# Patient Record
Sex: Male | Born: 1975 | Race: White | Hispanic: No | Smoking: Current every day smoker
Health system: Southern US, Community
[De-identification: ages and names within clinical notes are randomized; demographics above are authoritative.]

## PROBLEM LIST (undated history)

## (undated) DIAGNOSIS — K7460 Unspecified cirrhosis of liver: Secondary | ICD-10-CM

## (undated) HISTORY — PX: HERNIA REPAIR: SHX51

---

## 2004-10-05 ENCOUNTER — Emergency Department: Payer: Self-pay | Admitting: Emergency Medicine

## 2005-07-09 ENCOUNTER — Emergency Department: Payer: Self-pay | Admitting: Emergency Medicine

## 2006-05-12 ENCOUNTER — Emergency Department: Payer: Self-pay | Admitting: General Practice

## 2006-05-13 ENCOUNTER — Emergency Department: Payer: Self-pay | Admitting: Unknown Physician Specialty

## 2006-07-31 ENCOUNTER — Emergency Department (HOSPITAL_COMMUNITY): Admission: EM | Admit: 2006-07-31 | Discharge: 2006-07-31 | Payer: Self-pay | Admitting: Emergency Medicine

## 2006-08-12 ENCOUNTER — Emergency Department: Payer: Self-pay | Admitting: Emergency Medicine

## 2007-07-31 ENCOUNTER — Emergency Department: Payer: Self-pay | Admitting: Emergency Medicine

## 2011-09-07 ENCOUNTER — Emergency Department: Payer: Self-pay | Admitting: Emergency Medicine

## 2011-09-07 LAB — URINALYSIS, COMPLETE
Bacteria: NONE SEEN
Ketone: NEGATIVE
Protein: NEGATIVE
Specific Gravity: 1.005 (ref 1.003–1.030)
WBC UR: <1 /HPF (ref 0–5)

## 2011-11-25 ENCOUNTER — Emergency Department: Payer: Self-pay | Admitting: *Deleted

## 2011-12-05 ENCOUNTER — Inpatient Hospital Stay: Payer: Self-pay | Admitting: Internal Medicine

## 2011-12-05 LAB — CBC
HCT: 34.2 % — ABNORMAL LOW (ref 40.0–52.0)
HGB: 12.2 g/dL — ABNORMAL LOW (ref 13.0–18.0)
MCH: 40.6 pg — ABNORMAL HIGH (ref 26.0–34.0)
Platelet: 91 10*3/uL — ABNORMAL LOW (ref 150–440)
WBC: 10.5 10*3/uL (ref 3.8–10.6)

## 2011-12-05 LAB — URINALYSIS, COMPLETE
Nitrite: NEGATIVE
RBC,UR: NONE SEEN /HPF (ref 0–5)

## 2011-12-05 LAB — COMPREHENSIVE METABOLIC PANEL
Albumin: 1.5 g/dL — ABNORMAL LOW (ref 3.4–5.0)
Anion Gap: 11 (ref 7–16)
Bilirubin,Total: 16.4 mg/dL — ABNORMAL HIGH (ref 0.2–1.0)
Calcium, Total: 7.1 mg/dL — ABNORMAL LOW (ref 8.5–10.1)
Chloride: 81 mmol/L — ABNORMAL LOW (ref 98–107)
Creatinine: 0.52 mg/dL — ABNORMAL LOW (ref 0.60–1.30)
Glucose: 92 mg/dL (ref 65–99)
SGOT(AST): 232 U/L — ABNORMAL HIGH (ref 15–37)
Total Protein: 7.2 g/dL (ref 6.4–8.2)

## 2011-12-05 LAB — LIPASE, BLOOD: Lipase: 389 U/L (ref 73–393)

## 2011-12-05 LAB — DRUG SCREEN, URINE
Barbiturates, Ur Screen: NEGATIVE (ref ?–200)
Benzodiazepine, Ur Scrn: NEGATIVE (ref ?–200)
Cannabinoid 50 Ng, Ur ~~LOC~~: POSITIVE (ref ?–50)
Cocaine Metabolite,Ur ~~LOC~~: NEGATIVE (ref ?–300)
MDMA (Ecstasy)Ur Screen: NEGATIVE (ref ?–500)
Methadone, Ur Screen: NEGATIVE (ref ?–300)
Opiate, Ur Screen: NEGATIVE (ref ?–300)
Phencyclidine (PCP) Ur S: NEGATIVE (ref ?–25)
Tricyclic, Ur Screen: NEGATIVE (ref ?–1000)

## 2011-12-05 LAB — PROTIME-INR: Prothrombin Time: 29.1 secs — ABNORMAL HIGH (ref 11.5–14.7)

## 2011-12-05 LAB — TSH: Thyroid Stimulating Horm: 2.24 u[IU]/mL

## 2011-12-06 LAB — HEPATIC FUNCTION PANEL A (ARMC)
Alkaline Phosphatase: 112 U/L (ref 50–136)
Bilirubin,Total: 16.5 mg/dL — ABNORMAL HIGH (ref 0.2–1.0)

## 2011-12-06 LAB — CBC WITH DIFFERENTIAL/PLATELET
Basophil #: 0 10*3/uL (ref 0.0–0.1)
Eosinophil #: 0 10*3/uL (ref 0.0–0.7)
HCT: 30.6 % — ABNORMAL LOW (ref 40.0–52.0)
HGB: 11.1 g/dL — ABNORMAL LOW (ref 13.0–18.0)
Lymphocyte #: 0.5 10*3/uL — ABNORMAL LOW (ref 1.0–3.6)
Lymphocyte %: 5.5 %
MCH: 41.4 pg — ABNORMAL HIGH (ref 26.0–34.0)
Monocyte #: 0.6 x10 3/mm (ref 0.2–1.0)
Monocyte %: 7.1 %
Platelet: 72 10*3/uL — ABNORMAL LOW (ref 150–440)

## 2011-12-06 LAB — BASIC METABOLIC PANEL
Calcium, Total: 6.7 mg/dL — CL (ref 8.5–10.1)
Co2: 26 mmol/L (ref 21–32)
EGFR (African American): >60
Glucose: 52 mg/dL — ABNORMAL LOW (ref 65–99)
Sodium: 129 mmol/L — ABNORMAL LOW (ref 136–145)

## 2011-12-06 LAB — MAGNESIUM
Magnesium: 2 mg/dL
Magnesium: 2 mg/dL

## 2011-12-06 LAB — PROTIME-INR: Prothrombin Time: 24.5 secs — ABNORMAL HIGH (ref 11.5–14.7)

## 2011-12-06 LAB — HEMATOCRIT: HCT: 31.1 % — ABNORMAL LOW (ref 40.0–52.0)

## 2011-12-07 LAB — COMPREHENSIVE METABOLIC PANEL
Alkaline Phosphatase: 113 U/L (ref 50–136)
Calcium, Total: 7.3 mg/dL — ABNORMAL LOW (ref 8.5–10.1)
Chloride: 97 mmol/L — ABNORMAL LOW (ref 98–107)
Co2: 27 mmol/L (ref 21–32)
Creatinine: 0.52 mg/dL — ABNORMAL LOW (ref 0.60–1.30)
EGFR (African American): >60
EGFR (Non-African Amer.): >60
Potassium: 3.6 mmol/L (ref 3.5–5.1)
SGPT (ALT): 62 U/L
Sodium: 132 mmol/L — ABNORMAL LOW (ref 136–145)

## 2011-12-07 LAB — ACETAMINOPHEN LEVEL: Acetaminophen: <2 ug/mL

## 2011-12-07 LAB — URINE CULTURE

## 2011-12-07 LAB — MAGNESIUM: Magnesium: 2.1 mg/dL

## 2011-12-08 LAB — COMPREHENSIVE METABOLIC PANEL
Albumin: 1.5 g/dL — ABNORMAL LOW (ref 3.4–5.0)
Alkaline Phosphatase: 98 U/L (ref 50–136)
Anion Gap: 9 (ref 7–16)
BUN: 7 mg/dL (ref 7–18)
Bilirubin,Total: 18.1 mg/dL — ABNORMAL HIGH (ref 0.2–1.0)
Calcium, Total: 7.1 mg/dL — ABNORMAL LOW (ref 8.5–10.1)
Creatinine: 0.56 mg/dL — ABNORMAL LOW (ref 0.60–1.30)
EGFR (African American): >60
EGFR (Non-African Amer.): >60
Glucose: 76 mg/dL (ref 65–99)
Osmolality: 267 (ref 275–301)
SGOT(AST): 207 U/L — ABNORMAL HIGH (ref 15–37)
Sodium: 135 mmol/L — ABNORMAL LOW (ref 136–145)
Total Protein: 6.2 g/dL — ABNORMAL LOW (ref 6.4–8.2)

## 2011-12-08 LAB — PROTIME-INR: INR: 2

## 2011-12-08 LAB — APTT: Activated PTT: 65.3 secs — ABNORMAL HIGH (ref 23.6–35.9)

## 2011-12-09 LAB — COMPREHENSIVE METABOLIC PANEL
Anion Gap: 9 (ref 7–16)
Bilirubin,Total: 18.9 mg/dL — ABNORMAL HIGH (ref 0.2–1.0)
Calcium, Total: 7 mg/dL — CL (ref 8.5–10.1)
Co2: 25 mmol/L (ref 21–32)
EGFR (Non-African Amer.): >60
Glucose: 101 mg/dL — ABNORMAL HIGH (ref 65–99)
Osmolality: 267 (ref 275–301)
Potassium: 3.1 mmol/L — ABNORMAL LOW (ref 3.5–5.1)
SGOT(AST): 209 U/L — ABNORMAL HIGH (ref 15–37)
SGPT (ALT): 99 U/L — ABNORMAL HIGH

## 2011-12-09 LAB — HEMOGLOBIN: HGB: 10 g/dL — ABNORMAL LOW (ref 13.0–18.0)

## 2011-12-09 LAB — MAGNESIUM: Magnesium: 1.8 mg/dL

## 2011-12-10 LAB — COMPREHENSIVE METABOLIC PANEL
Albumin: 1.6 g/dL — ABNORMAL LOW (ref 3.4–5.0)
Anion Gap: 8 (ref 7–16)
Bilirubin,Total: 19 mg/dL — ABNORMAL HIGH (ref 0.2–1.0)
Co2: 28 mmol/L (ref 21–32)
Creatinine: 0.65 mg/dL (ref 0.60–1.30)
EGFR (African American): >60
EGFR (Non-African Amer.): >60
Glucose: 95 mg/dL (ref 65–99)
Osmolality: 272 (ref 275–301)
SGPT (ALT): 114 U/L — ABNORMAL HIGH
Sodium: 137 mmol/L (ref 136–145)
Total Protein: 6.5 g/dL (ref 6.4–8.2)

## 2011-12-10 LAB — CBC WITH DIFFERENTIAL/PLATELET
HCT: 29.2 % — ABNORMAL LOW (ref 40.0–52.0)
HGB: 10.3 g/dL — ABNORMAL LOW (ref 13.0–18.0)
MCHC: 35.2 g/dL (ref 32.0–36.0)
Myelocyte: 1 %
Platelet: 127 10*3/uL — ABNORMAL LOW (ref 150–440)
RBC: 2.47 10*6/uL — ABNORMAL LOW (ref 4.40–5.90)
RDW: 18.8 % — ABNORMAL HIGH (ref 11.5–14.5)
Segmented Neutrophils: 76 %
WBC: 17.1 10*3/uL — ABNORMAL HIGH (ref 3.8–10.6)

## 2011-12-11 LAB — BASIC METABOLIC PANEL
Anion Gap: 8 (ref 7–16)
BUN: 8 mg/dL (ref 7–18)
Chloride: 100 mmol/L (ref 98–107)
Co2: 27 mmol/L (ref 21–32)
EGFR (African American): >60
Osmolality: 268 (ref 275–301)
Potassium: 3.6 mmol/L (ref 3.5–5.1)

## 2011-12-11 LAB — CULTURE, BLOOD (SINGLE)

## 2011-12-11 LAB — HEPATIC FUNCTION PANEL A (ARMC)
Alkaline Phosphatase: 122 U/L (ref 50–136)
Bilirubin, Direct: 14.3 mg/dL — ABNORMAL HIGH (ref 0.00–0.20)
SGOT(AST): 195 U/L — ABNORMAL HIGH (ref 15–37)

## 2011-12-16 ENCOUNTER — Inpatient Hospital Stay: Payer: Self-pay | Admitting: Internal Medicine

## 2011-12-16 LAB — URINALYSIS, COMPLETE
Glucose,UR: NEGATIVE mg/dL (ref 0–75)
Ketone: NEGATIVE
Leukocyte Esterase: NEGATIVE
Nitrite: NEGATIVE
Specific Gravity: 1.013 (ref 1.003–1.030)
WBC UR: 1 /HPF (ref 0–5)

## 2011-12-16 LAB — PROTIME-INR: INR: 1.7

## 2011-12-16 LAB — CBC WITH DIFFERENTIAL/PLATELET
Basophil #: 0.2 10*3/uL — ABNORMAL HIGH (ref 0.0–0.1)
Basophil %: 1 %
Eosinophil #: 0 10*3/uL (ref 0.0–0.7)
Eosinophil %: 0.1 %
HCT: 29 % — ABNORMAL LOW (ref 40.0–52.0)
Lymphocyte #: 0.3 10*3/uL — ABNORMAL LOW (ref 1.0–3.6)
Lymphocyte %: 1.7 %
Monocyte #: 1 x10 3/mm (ref 0.2–1.0)
Neutrophil #: 18.9 10*3/uL — ABNORMAL HIGH (ref 1.4–6.5)
Neutrophil %: 92.5 %
Platelet: 219 10*3/uL (ref 150–440)
RBC: 2.42 10*6/uL — ABNORMAL LOW (ref 4.40–5.90)
RDW: 17.4 % — ABNORMAL HIGH (ref 11.5–14.5)

## 2011-12-16 LAB — COMPREHENSIVE METABOLIC PANEL
Alkaline Phosphatase: 141 U/L — ABNORMAL HIGH (ref 50–136)
BUN: 6 mg/dL — ABNORMAL LOW (ref 7–18)
Calcium, Total: 7 mg/dL — CL (ref 8.5–10.1)
Chloride: 95 mmol/L — ABNORMAL LOW (ref 98–107)
Co2: 27 mmol/L (ref 21–32)
Creatinine: 0.55 mg/dL — ABNORMAL LOW (ref 0.60–1.30)
EGFR (Non-African Amer.): >60
Glucose: 125 mg/dL — ABNORMAL HIGH (ref 65–99)
Osmolality: 260 (ref 275–301)
Potassium: 3.7 mmol/L (ref 3.5–5.1)
SGOT(AST): 175 U/L — ABNORMAL HIGH (ref 15–37)
Sodium: 130 mmol/L — ABNORMAL LOW (ref 136–145)

## 2011-12-16 LAB — ETHANOL
Ethanol %: <0.003 % (ref 0.000–0.080)
Ethanol: <3 mg/dL

## 2011-12-17 LAB — CBC WITH DIFFERENTIAL/PLATELET
Basophil #: 0.5 10*3/uL — ABNORMAL HIGH (ref 0.0–0.1)
Basophil %: 2.4 %
Eosinophil #: 0 10*3/uL (ref 0.0–0.7)
Eosinophil %: 0 %
HCT: 30.5 % — ABNORMAL LOW (ref 40.0–52.0)
HGB: 10.2 g/dL — ABNORMAL LOW (ref 13.0–18.0)
Lymphocyte %: 3.1 %
MCH: 40.5 pg — ABNORMAL HIGH (ref 26.0–34.0)
MCHC: 33.5 g/dL (ref 32.0–36.0)
Monocyte #: 1 x10 3/mm (ref 0.2–1.0)
Neutrophil %: 89.7 %
Platelet: 218 10*3/uL (ref 150–440)
RBC: 2.52 10*6/uL — ABNORMAL LOW (ref 4.40–5.90)
RDW: 17.7 % — ABNORMAL HIGH (ref 11.5–14.5)
WBC: 20.1 10*3/uL — ABNORMAL HIGH (ref 3.8–10.6)

## 2011-12-17 LAB — COMPREHENSIVE METABOLIC PANEL
Alkaline Phosphatase: 146 U/L — ABNORMAL HIGH (ref 50–136)
Bilirubin,Total: 17.3 mg/dL — ABNORMAL HIGH (ref 0.2–1.0)
Co2: 28 mmol/L (ref 21–32)
Creatinine: 0.44 mg/dL — ABNORMAL LOW (ref 0.60–1.30)
Osmolality: 266 (ref 275–301)
SGOT(AST): 168 U/L — ABNORMAL HIGH (ref 15–37)
SGPT (ALT): 148 U/L — ABNORMAL HIGH

## 2011-12-19 LAB — COMPREHENSIVE METABOLIC PANEL
Albumin: 1.4 g/dL — ABNORMAL LOW (ref 3.4–5.0)
Alkaline Phosphatase: 122 U/L (ref 50–136)
Anion Gap: 8 (ref 7–16)
Bilirubin,Total: 11.8 mg/dL — ABNORMAL HIGH (ref 0.2–1.0)
Calcium, Total: 7.1 mg/dL — ABNORMAL LOW (ref 8.5–10.1)
Chloride: 100 mmol/L (ref 98–107)
Co2: 28 mmol/L (ref 21–32)
Creatinine: 0.65 mg/dL (ref 0.60–1.30)
EGFR (African American): >60
EGFR (Non-African Amer.): >60
Glucose: 122 mg/dL — ABNORMAL HIGH (ref 65–99)
Osmolality: 272 (ref 275–301)
Potassium: 3.3 mmol/L — ABNORMAL LOW (ref 3.5–5.1)
Sodium: 136 mmol/L (ref 136–145)
Total Protein: 6.2 g/dL — ABNORMAL LOW (ref 6.4–8.2)

## 2011-12-20 LAB — BASIC METABOLIC PANEL
Anion Gap: 8 (ref 7–16)
Calcium, Total: 7.1 mg/dL — ABNORMAL LOW (ref 8.5–10.1)
Chloride: 98 mmol/L (ref 98–107)
Co2: 29 mmol/L (ref 21–32)
Creatinine: 0.46 mg/dL — ABNORMAL LOW (ref 0.60–1.30)
EGFR (African American): >60
EGFR (Non-African Amer.): >60
Osmolality: 268 (ref 275–301)
Potassium: 3.2 mmol/L — ABNORMAL LOW (ref 3.5–5.1)

## 2011-12-20 LAB — CBC WITH DIFFERENTIAL/PLATELET
Basophil #: 0.3 10*3/uL — ABNORMAL HIGH (ref 0.0–0.1)
Basophil %: 1.3 %
Eosinophil #: 0.1 10*3/uL (ref 0.0–0.7)
Eosinophil %: 0.3 %
HGB: 9 g/dL — ABNORMAL LOW (ref 13.0–18.0)
Lymphocyte %: 5.2 %
MCH: 40.5 pg — ABNORMAL HIGH (ref 26.0–34.0)
MCHC: 33.7 g/dL (ref 32.0–36.0)
MCV: 120 fL — ABNORMAL HIGH (ref 80–100)
Monocyte #: 1.7 x10 3/mm — ABNORMAL HIGH (ref 0.2–1.0)
Monocyte %: 7.8 %
Neutrophil %: 85.4 %
Platelet: 171 10*3/uL (ref 150–440)
RBC: 2.21 10*6/uL — ABNORMAL LOW (ref 4.40–5.90)
RDW: 16.9 % — ABNORMAL HIGH (ref 11.5–14.5)
WBC: 20.9 10*3/uL — ABNORMAL HIGH (ref 3.8–10.6)

## 2011-12-20 LAB — MAGNESIUM: Magnesium: 1.7 mg/dL — ABNORMAL LOW

## 2011-12-21 LAB — CULTURE, BLOOD (SINGLE)

## 2011-12-22 LAB — COMPREHENSIVE METABOLIC PANEL
BUN: 9 mg/dL (ref 7–18)
Chloride: 102 mmol/L (ref 98–107)
Creatinine: 0.6 mg/dL (ref 0.60–1.30)
EGFR (Non-African Amer.): >60
Glucose: 105 mg/dL — ABNORMAL HIGH (ref 65–99)
Osmolality: 275 (ref 275–301)
Potassium: 3 mmol/L — ABNORMAL LOW (ref 3.5–5.1)
SGPT (ALT): 159 U/L — ABNORMAL HIGH
Sodium: 138 mmol/L (ref 136–145)

## 2011-12-22 LAB — CBC WITH DIFFERENTIAL/PLATELET
Basophil #: 0.3 10*3/uL — ABNORMAL HIGH (ref 0.0–0.1)
Basophil %: 1.2 %
Eosinophil #: 0 10*3/uL (ref 0.0–0.7)
HGB: 9.4 g/dL — ABNORMAL LOW (ref 13.0–18.0)
Lymphocyte %: 4.4 %
MCH: 40.5 pg — ABNORMAL HIGH (ref 26.0–34.0)
MCHC: 33.7 g/dL (ref 32.0–36.0)
Neutrophil %: 87.3 %
Platelet: 169 10*3/uL (ref 150–440)
RBC: 2.32 10*6/uL — ABNORMAL LOW (ref 4.40–5.90)
WBC: 23.7 10*3/uL — ABNORMAL HIGH (ref 3.8–10.6)

## 2011-12-22 LAB — PROTIME-INR
INR: 1.8
Prothrombin Time: 21.2 secs — ABNORMAL HIGH (ref 11.5–14.7)

## 2011-12-28 ENCOUNTER — Emergency Department: Payer: Self-pay | Admitting: Emergency Medicine

## 2011-12-28 LAB — COMPREHENSIVE METABOLIC PANEL
Albumin: 1.6 g/dL — ABNORMAL LOW (ref 3.4–5.0)
Alkaline Phosphatase: 138 U/L — ABNORMAL HIGH (ref 50–136)
Anion Gap: 9 (ref 7–16)
Calcium, Total: 7.6 mg/dL — ABNORMAL LOW (ref 8.5–10.1)
Chloride: 103 mmol/L (ref 98–107)
Co2: 24 mmol/L (ref 21–32)
Creatinine: 0.96 mg/dL (ref 0.60–1.30)
EGFR (Non-African Amer.): >60
Glucose: 171 mg/dL — ABNORMAL HIGH (ref 65–99)
Osmolality: 274 (ref 275–301)
Potassium: 3.8 mmol/L (ref 3.5–5.1)
SGOT(AST): 234 U/L — ABNORMAL HIGH (ref 15–37)
Sodium: 136 mmol/L (ref 136–145)
Total Protein: 7.1 g/dL (ref 6.4–8.2)

## 2011-12-28 LAB — CBC
HCT: 27 % — ABNORMAL LOW (ref 40.0–52.0)
HCT: 26.1 % — ABNORMAL LOW (ref 40.0–52.0)
MCH: 39.4 pg — ABNORMAL HIGH (ref 26.0–34.0)
MCH: 39.7 pg — ABNORMAL HIGH (ref 26.0–34.0)
MCV: 119 fL — ABNORMAL HIGH (ref 80–100)
MCV: 118 fL — ABNORMAL HIGH (ref 80–100)
Platelet: 246 10*3/uL (ref 150–440)
RBC: 2.21 10*6/uL — ABNORMAL LOW (ref 4.40–5.90)
RDW: 14.5 % (ref 11.5–14.5)
WBC: 15.1 10*3/uL — ABNORMAL HIGH (ref 3.8–10.6)
WBC: 14.8 10*3/uL — ABNORMAL HIGH (ref 3.8–10.6)

## 2011-12-28 LAB — PROTIME-INR
INR: 1.8
Prothrombin Time: 21.2 secs — ABNORMAL HIGH (ref 11.5–14.7)

## 2012-01-07 ENCOUNTER — Emergency Department: Payer: Self-pay | Admitting: *Deleted

## 2012-01-07 LAB — CBC
HCT: 37.7 % — ABNORMAL LOW (ref 40.0–52.0)
Platelet: 300 10*3/uL (ref 150–440)
RBC: 3.39 10*6/uL — ABNORMAL LOW (ref 4.40–5.90)
RDW: 14 % (ref 11.5–14.5)
WBC: 10.4 10*3/uL (ref 3.8–10.6)

## 2012-01-07 LAB — COMPREHENSIVE METABOLIC PANEL
Alkaline Phosphatase: 183 U/L — ABNORMAL HIGH (ref 50–136)
Chloride: 100 mmol/L (ref 98–107)
Co2: 29 mmol/L (ref 21–32)
Creatinine: 0.94 mg/dL (ref 0.60–1.30)
EGFR (Non-African Amer.): >60
Sodium: 135 mmol/L — ABNORMAL LOW (ref 136–145)

## 2012-01-07 LAB — PROTIME-INR
INR: 1.9
Prothrombin Time: 22.5 secs — ABNORMAL HIGH (ref 11.5–14.7)

## 2012-01-08 LAB — URINALYSIS, COMPLETE
Bacteria: NONE SEEN
Glucose,UR: NEGATIVE mg/dL (ref 0–75)
Leukocyte Esterase: NEGATIVE
Nitrite: NEGATIVE
RBC,UR: NONE SEEN /HPF (ref 0–5)
Squamous Epithelial: NONE SEEN

## 2012-01-25 ENCOUNTER — Emergency Department: Payer: Self-pay | Admitting: Emergency Medicine

## 2012-01-25 LAB — COMPREHENSIVE METABOLIC PANEL
Albumin: 2.3 g/dL — ABNORMAL LOW (ref 3.4–5.0)
Alkaline Phosphatase: 154 U/L — ABNORMAL HIGH (ref 50–136)
BUN: 6 mg/dL — ABNORMAL LOW (ref 7–18)
Calcium, Total: 8.3 mg/dL — ABNORMAL LOW (ref 8.5–10.1)
EGFR (African American): >60
Glucose: 147 mg/dL — ABNORMAL HIGH (ref 65–99)
SGOT(AST): 106 U/L — ABNORMAL HIGH (ref 15–37)
Total Protein: 9 g/dL — ABNORMAL HIGH (ref 6.4–8.2)

## 2012-01-25 LAB — CBC
MCH: 34.1 pg — ABNORMAL HIGH (ref 26.0–34.0)
MCHC: 32.9 g/dL (ref 32.0–36.0)
Platelet: 196 10*3/uL (ref 150–440)
RBC: 3.56 10*6/uL — ABNORMAL LOW (ref 4.40–5.90)

## 2012-01-25 LAB — URINALYSIS, COMPLETE
Granular Cast: 57
Hyaline Cast: 23
Leukocyte Esterase: NEGATIVE
Nitrite: NEGATIVE
Ph: 5 (ref 4.5–8.0)
Protein: 30
RBC,UR: 18 /HPF (ref 0–5)

## 2012-02-02 ENCOUNTER — Emergency Department: Payer: Self-pay | Admitting: *Deleted

## 2012-02-02 LAB — COMPREHENSIVE METABOLIC PANEL
Albumin: 2.2 g/dL — ABNORMAL LOW (ref 3.4–5.0)
Anion Gap: 9 (ref 7–16)
BUN: 3 mg/dL — ABNORMAL LOW (ref 7–18)
Bilirubin,Total: 3.9 mg/dL — ABNORMAL HIGH (ref 0.2–1.0)
Chloride: 104 mmol/L (ref 98–107)
Glucose: 106 mg/dL — ABNORMAL HIGH (ref 65–99)
Osmolality: 273 (ref 275–301)
Potassium: 3.9 mmol/L (ref 3.5–5.1)
Sodium: 138 mmol/L (ref 136–145)
Total Protein: 8.2 g/dL (ref 6.4–8.2)

## 2012-02-02 LAB — URINALYSIS, COMPLETE
Hyaline Cast: 6
Ketone: NEGATIVE
Ph: 5 (ref 4.5–8.0)
Protein: NEGATIVE
Specific Gravity: 1.016 (ref 1.003–1.030)
WBC UR: NONE SEEN /HPF (ref 0–5)

## 2012-02-02 LAB — CBC
HGB: 11.9 g/dL — ABNORMAL LOW (ref 13.0–18.0)
Platelet: 174 10*3/uL (ref 150–440)
RBC: 3.41 10*6/uL — ABNORMAL LOW (ref 4.40–5.90)
WBC: 6.7 10*3/uL (ref 3.8–10.6)

## 2012-02-02 LAB — APTT: Activated PTT: 59.6 secs — ABNORMAL HIGH (ref 23.6–35.9)

## 2012-02-02 LAB — PROTIME-INR: Prothrombin Time: 25.2 secs — ABNORMAL HIGH (ref 11.5–14.7)

## 2012-02-07 ENCOUNTER — Emergency Department: Payer: Self-pay | Admitting: Unknown Physician Specialty

## 2012-05-14 ENCOUNTER — Emergency Department: Payer: Self-pay | Admitting: Emergency Medicine

## 2012-05-14 LAB — COMPREHENSIVE METABOLIC PANEL
Anion Gap: 9 (ref 7–16)
Bilirubin,Total: 6.4 mg/dL — ABNORMAL HIGH (ref 0.2–1.0)
Calcium, Total: 8.3 mg/dL — ABNORMAL LOW (ref 8.5–10.1)
Chloride: 105 mmol/L (ref 98–107)
Co2: 27 mmol/L (ref 21–32)
Creatinine: 0.77 mg/dL (ref 0.60–1.30)
EGFR (African American): >60
EGFR (Non-African Amer.): >60
Glucose: 92 mg/dL (ref 65–99)
Osmolality: 279 (ref 275–301)
Potassium: 3.6 mmol/L (ref 3.5–5.1)
Sodium: 141 mmol/L (ref 136–145)

## 2012-05-14 LAB — CBC
MCHC: 35.5 g/dL (ref 32.0–36.0)
RDW: 14.3 % (ref 11.5–14.5)

## 2012-05-14 LAB — URINALYSIS, COMPLETE
Glucose,UR: NEGATIVE mg/dL (ref 0–75)
Leukocyte Esterase: NEGATIVE
Nitrite: NEGATIVE
Ph: 6 (ref 4.5–8.0)
Protein: 30
RBC,UR: NONE SEEN /HPF (ref 0–5)

## 2013-02-25 ENCOUNTER — Ambulatory Visit: Payer: Self-pay | Admitting: Nurse Practitioner

## 2013-03-08 IMAGING — CT CT ABD-PELV W/ CM
1 of 2 series · 15 of 32 positions shown, 19 images · IV contrast (isovue)
Comparison: None

REASON FOR EXAM: COMMENTS:

PROCEDURE:     CT  - CT ABDOMEN / PELVIS  W  - December 16, 2011 [DATE]
RESULT:      History: Abdominal pain
TECHNIQUE: Multiple axial images of the abdomen and pelvis were performed
from the lung bases to the pubic symphysis, with p.o. contrast and with 100
ml of Isovue 370 intravenous contrast.

[Series 2: 3mm soft tissue · axial · 0.79mm/px · z∈[-550,-55]mm · 15 of 181 slices shown, 19 images]
[im 8/181  soft-tissue]
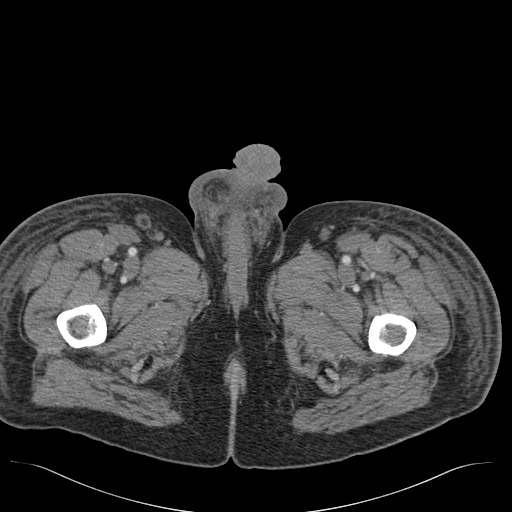
[im 8/181  bone]
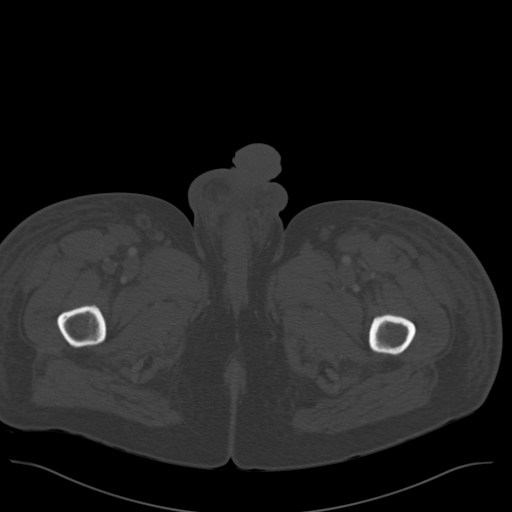
[im 23/181  soft-tissue]
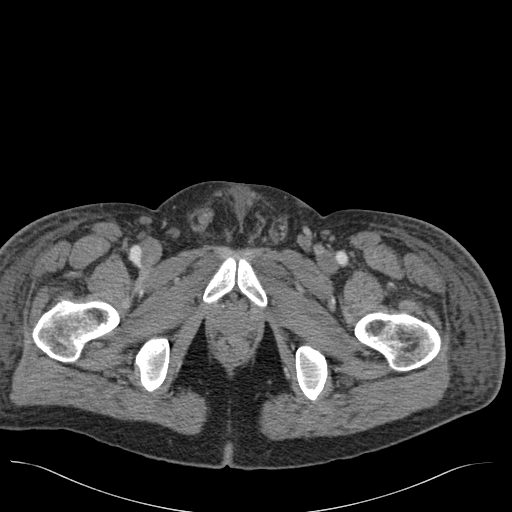
[im 38/181  soft-tissue]
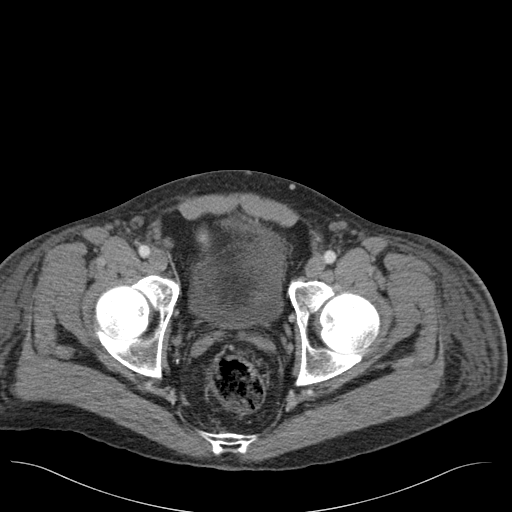
[im 53/181  soft-tissue]
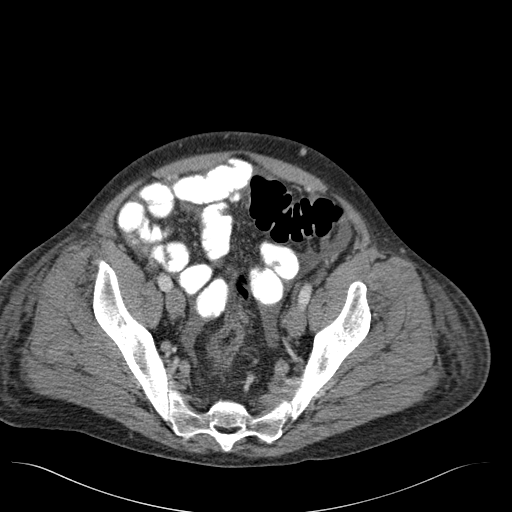
[im 61/181  soft-tissue]
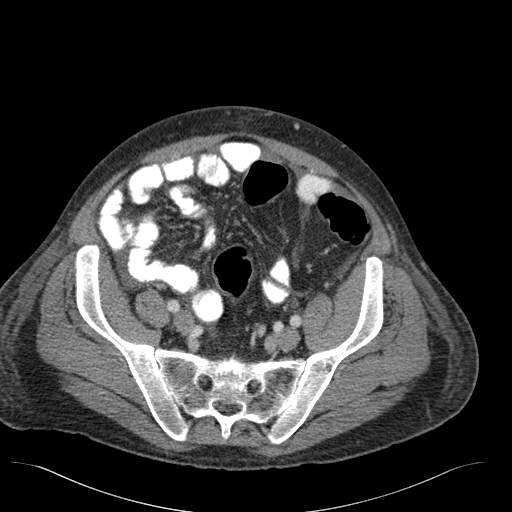
[im 76/181  soft-tissue]
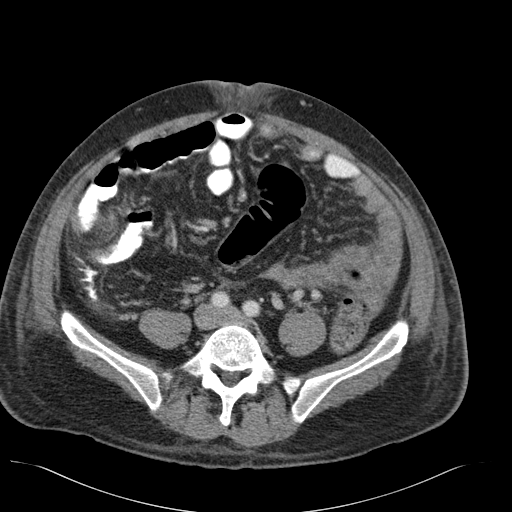
[im 91/181  soft-tissue]
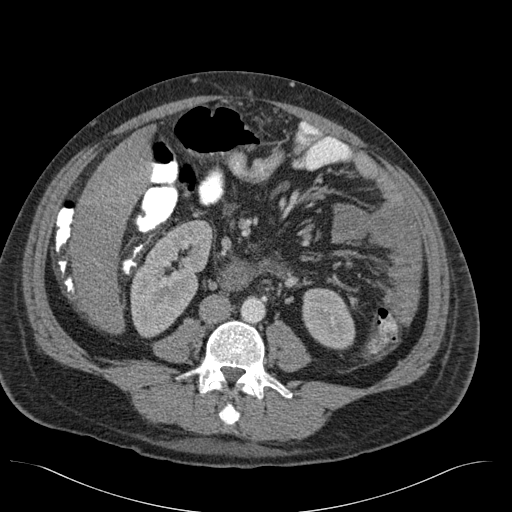
[im 106/181  soft-tissue]
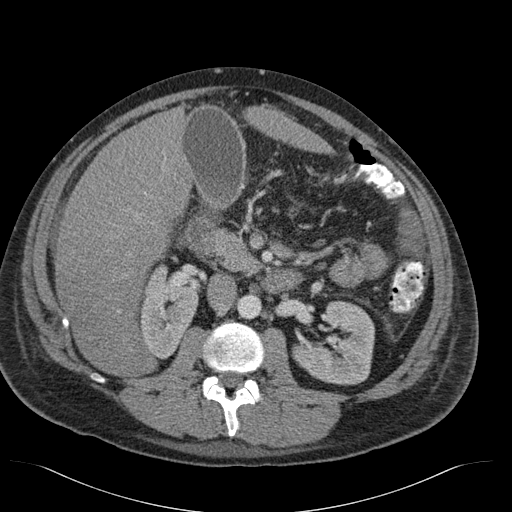
[im 121/181  soft-tissue]
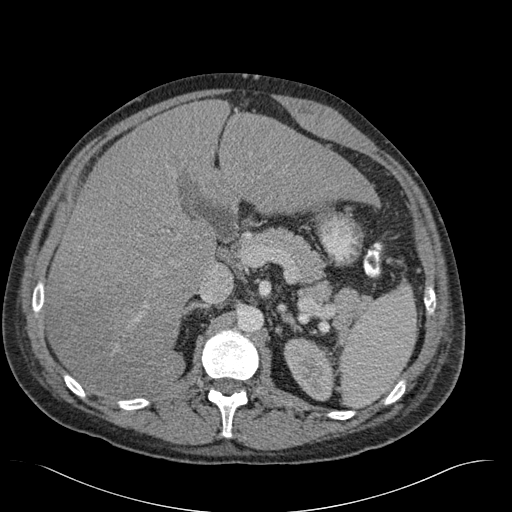
[im 121/181  bone]
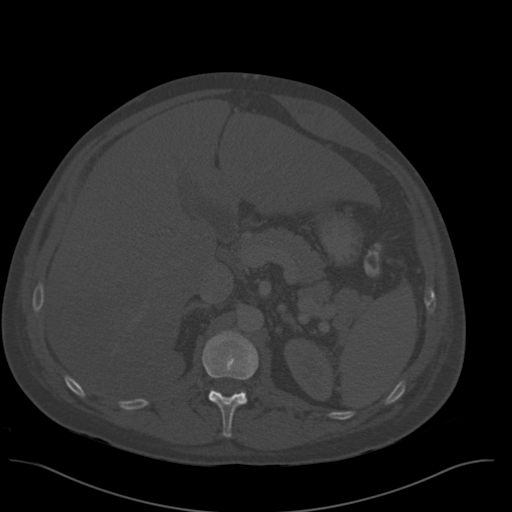
[im 128/181  soft-tissue]
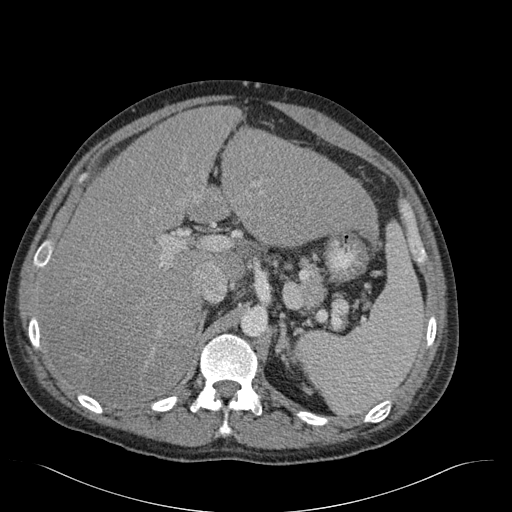
[im 143/181  soft-tissue]
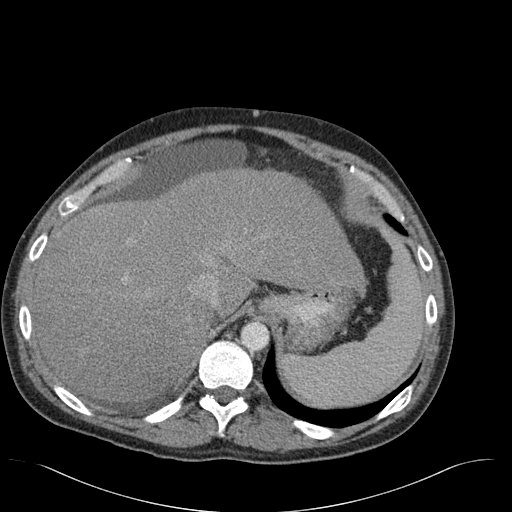
[im 151/181  lung]
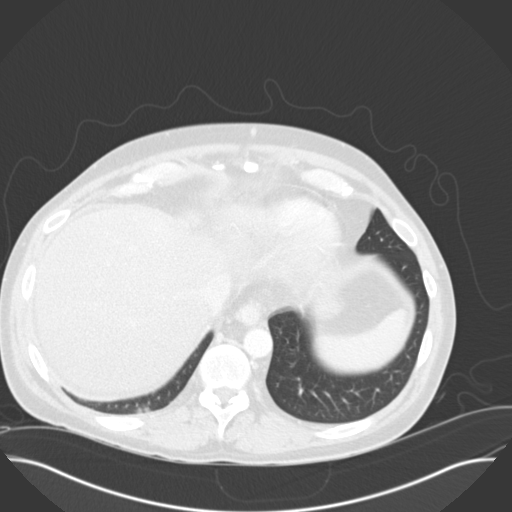
[im 158/181  soft-tissue]
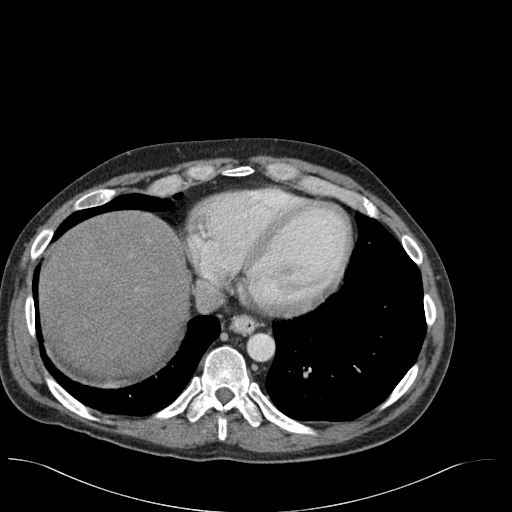
[im 158/181  lung]
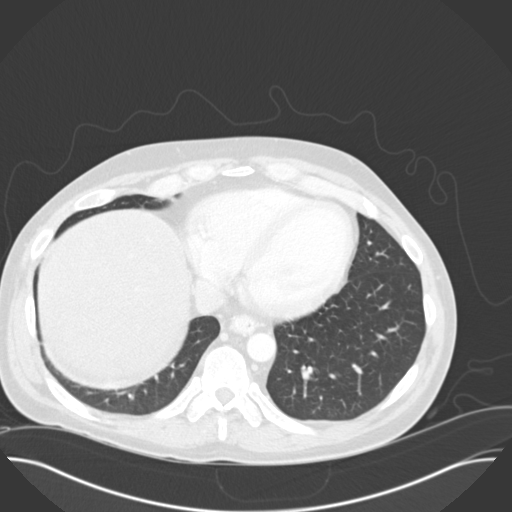
[im 166/181  lung]
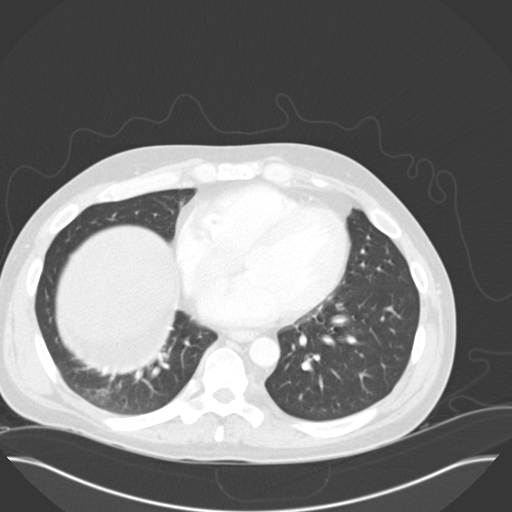
[im 173/181  soft-tissue]
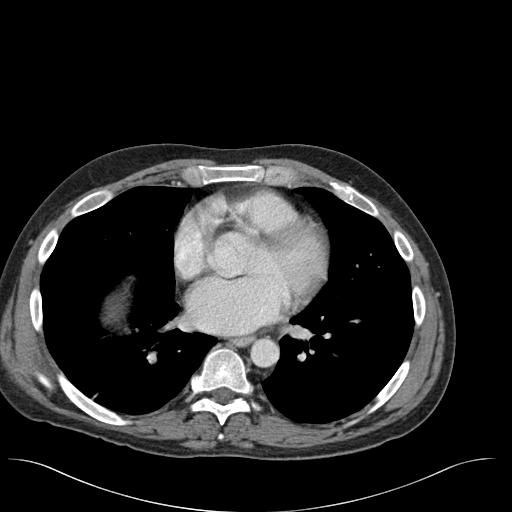
[im 173/181  lung]
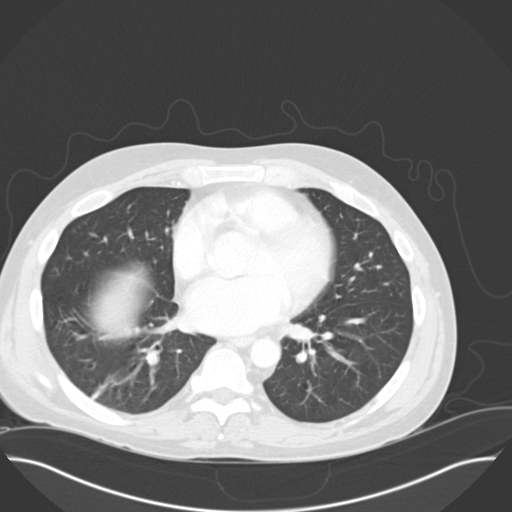

[15 of 32 positions shown; findings below may reference images not displayed]

FINDINGS: The lung bases are clear. There is no pneumothorax. The heart size is normal.

There is gallbladder wall thickening and a small amount of pericholecystic
fluid. There are tiny cholelithiasis. There is a small amount of perihepatic
free fluid. The liver is diffusely low in attenuation likely secondary to
hepatic steatosis. There is no intrahepatic or extrahepatic biliary ductal
dilatation.  The spleen demonstrates no focal abnormality. The kidneys,
adrenal glands, and pancreas are normal. The bladder is unremarkable.

The stomach, duodenum, small intestine, and large intestine demonstrate no
contrast extravasation or dilatation.  There is no pneumoperitoneum,
pneumatosis, or portal venous gas. There is a moderate amount of pelvic free
fluid. There is no lymphadenopathy.

The abdominal aorta is normal in caliber .

The osseous structures are unremarkable. The left rectus sheath is expanded
with heterogeneous high attenuation material within it likely representing a
left rectus sheath hematoma.
IMPRESSION: 1. The gallbladder is distended with tiny cholelithiasis, gallbladder wall
thickening and pericholecystic fluid. There is minimal inflammatory
stranding in the adjacent pericholecystic fat near the gallbladder neck. The
overall appearance is concerning for acute cholecystitis. Correlate with
clinical exam. If there is further clinical concern evaluation with a HIDA
scan or ultrasound is recommended.

2. Hepatic steatosis.

3. The left rectus sheath is expanded with heterogeneous high attenuation
material within it likely representing a left rectus sheath hematoma.

[REDACTED]

## 2013-03-31 IMAGING — CR DG CHEST 2V
1 series · 2 of 2 positions shown · non-contrast
Comparison: none

REASON FOR EXAM: sob
COMMENTS:

[Series 1: w chest pa · 0.14mm/px · 2 of 2 slices shown]
[im 1/2]
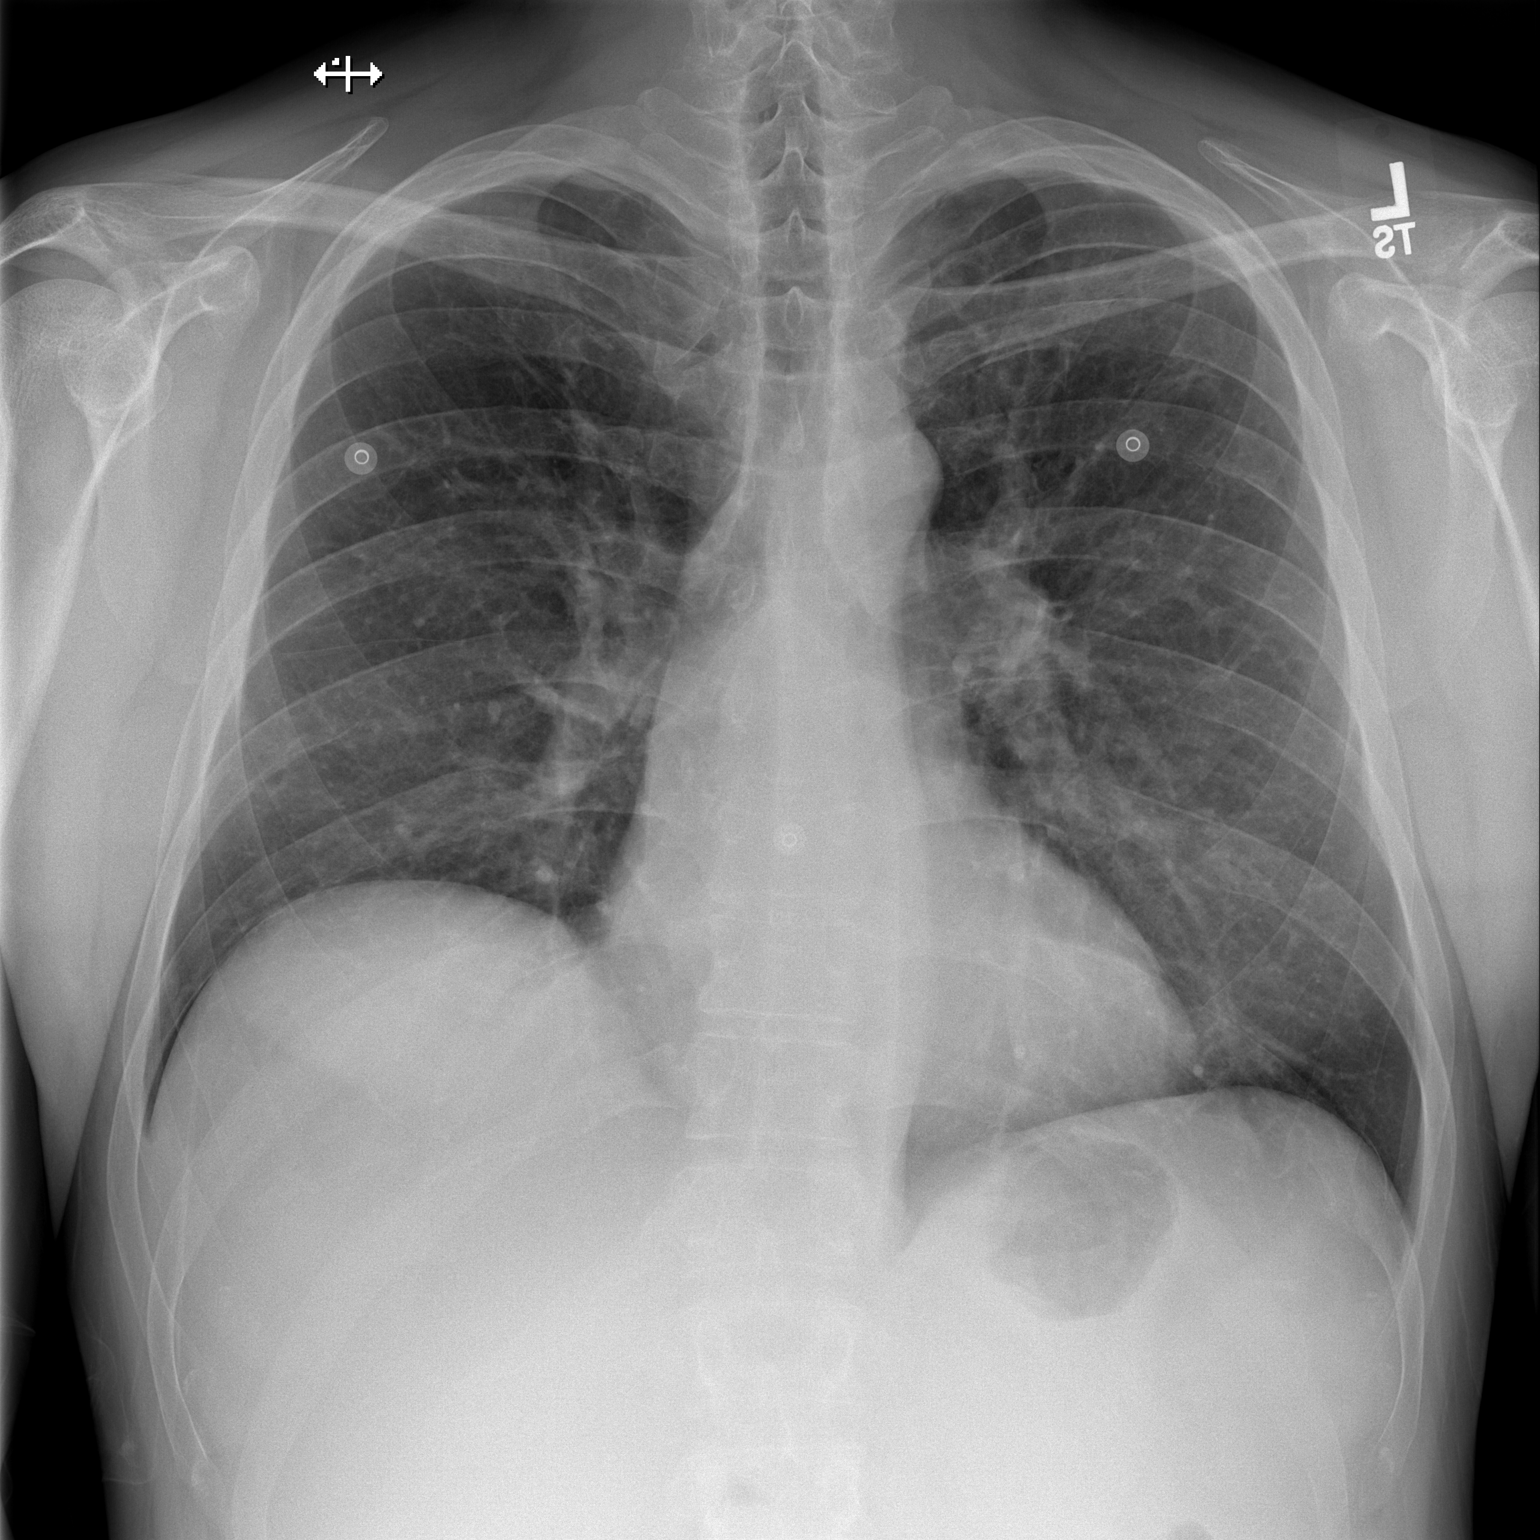
[im 2/2]
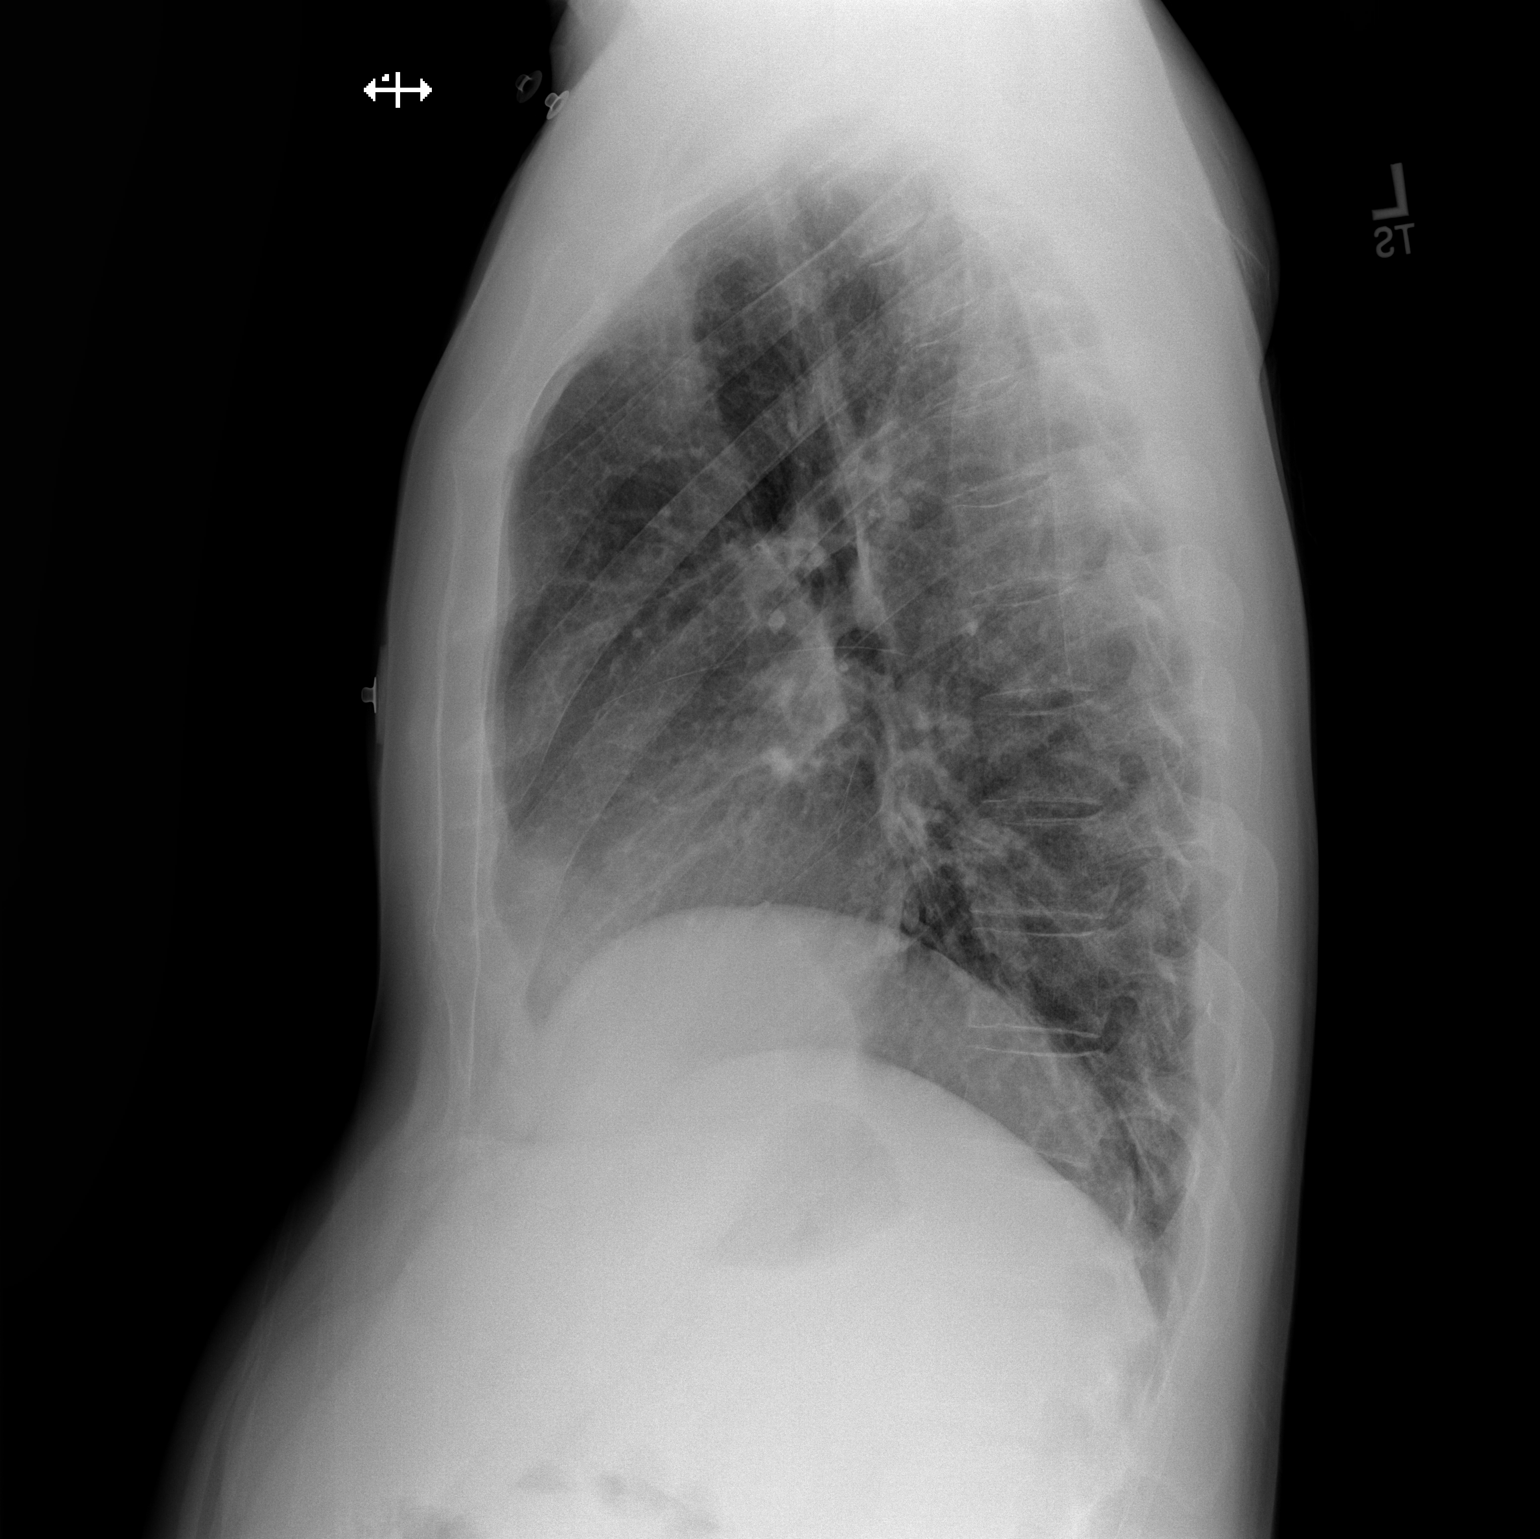

[2 of 2 positions shown; findings below may reference images not displayed]

PROCEDURE:     DXR - DXR CHEST PA (OR AP) AND LATERAL  - January 08, 2012  [DATE]

RESULT:     The lungs are adequately inflated. The right hemidiaphragm is
higher than the left which may be normal for the patient. The perihilar lung
markings are mildly prominent. The cardiac silhouette is normal in size. The
pulmonary vascularity is not engorged. There is no pleural effusion. The
mediastinum is normal in width. No free subdiaphragmatic gas collections are
identified.
IMPRESSION: There are mildly increased interstitial markings in the
lungs which may be normal for the patient, but low-grade interstitial edema
cannot be excluded. A followup PA and lateral chest x-ray with deep
inspiration would be useful if the patient is having persistent dyspnea.

## 2013-08-30 ENCOUNTER — Ambulatory Visit: Payer: Self-pay | Admitting: Gastroenterology

## 2013-12-09 ENCOUNTER — Emergency Department: Payer: Self-pay | Admitting: Emergency Medicine

## 2013-12-09 LAB — URINALYSIS, COMPLETE
Bilirubin,UR: NEGATIVE
Blood: NEGATIVE
Glucose,UR: NEGATIVE mg/dL (ref 0–75)
KETONE: NEGATIVE
Leukocyte Esterase: NEGATIVE
NITRITE: NEGATIVE
PH: 6 (ref 4.5–8.0)
PROTEIN: NEGATIVE
RBC,UR: <1 /HPF (ref 0–5)
SQUAMOUS EPITHELIAL: NONE SEEN
Specific Gravity: 1.009 (ref 1.003–1.030)
WBC UR: NONE SEEN /HPF (ref 0–5)

## 2013-12-09 LAB — COMPREHENSIVE METABOLIC PANEL
ALK PHOS: 78 U/L
ALT: 19 U/L (ref 12–78)
Albumin: 4.2 g/dL (ref 3.4–5.0)
Anion Gap: 3 — ABNORMAL LOW (ref 7–16)
BILIRUBIN TOTAL: 1.2 mg/dL — AB (ref 0.2–1.0)
BUN: 9 mg/dL (ref 7–18)
Calcium, Total: 8.9 mg/dL (ref 8.5–10.1)
Chloride: 107 mmol/L (ref 98–107)
Co2: 31 mmol/L (ref 21–32)
Creatinine: 0.85 mg/dL (ref 0.60–1.30)
EGFR (African American): >60
GLUCOSE: 71 mg/dL (ref 65–99)
Osmolality: 278 (ref 275–301)
Potassium: 3.8 mmol/L (ref 3.5–5.1)
SGOT(AST): 19 U/L (ref 15–37)
Sodium: 141 mmol/L (ref 136–145)
TOTAL PROTEIN: 8.2 g/dL (ref 6.4–8.2)

## 2013-12-09 LAB — CBC
HCT: 49.3 % (ref 40.0–52.0)
HGB: 16.8 g/dL (ref 13.0–18.0)
MCH: 31.4 pg (ref 26.0–34.0)
MCHC: 34 g/dL (ref 32.0–36.0)
MCV: 93 fL (ref 80–100)
Platelet: 158 10*3/uL (ref 150–440)
RBC: 5.33 10*6/uL (ref 4.40–5.90)
RDW: 13.7 % (ref 11.5–14.5)
WBC: 5.3 10*3/uL (ref 3.8–10.6)

## 2013-12-09 LAB — SALICYLATE LEVEL: Salicylates, Serum: <1.7 mg/dL

## 2013-12-09 LAB — DRUG SCREEN, URINE
Amphetamines, Ur Screen: NEGATIVE (ref ?–1000)
BENZODIAZEPINE, UR SCRN: POSITIVE (ref ?–200)
Barbiturates, Ur Screen: NEGATIVE (ref ?–200)
COCAINE METABOLITE, UR ~~LOC~~: POSITIVE (ref ?–300)
Cannabinoid 50 Ng, Ur ~~LOC~~: NEGATIVE (ref ?–50)
MDMA (Ecstasy)Ur Screen: NEGATIVE (ref ?–500)
METHADONE, UR SCREEN: NEGATIVE (ref ?–300)
Opiate, Ur Screen: NEGATIVE (ref ?–300)
Phencyclidine (PCP) Ur S: NEGATIVE (ref ?–25)
TRICYCLIC, UR SCREEN: NEGATIVE (ref ?–1000)

## 2013-12-09 LAB — ACETAMINOPHEN LEVEL

## 2013-12-09 LAB — ETHANOL: Ethanol %: <0.003 % (ref 0.000–0.080)

## 2014-06-02 ENCOUNTER — Emergency Department: Payer: Self-pay | Admitting: Emergency Medicine

## 2014-07-14 ENCOUNTER — Emergency Department: Payer: Self-pay | Admitting: Emergency Medicine

## 2014-08-16 ENCOUNTER — Emergency Department: Payer: Self-pay | Admitting: Internal Medicine

## 2014-09-09 ENCOUNTER — Emergency Department: Payer: Self-pay | Admitting: Emergency Medicine

## 2014-09-16 ENCOUNTER — Encounter: Payer: Self-pay | Admitting: Internal Medicine

## 2014-10-30 NOTE — Consult Note (Signed)
PATIENT NAME:  Kevin Gardner, Kevin Gardner MR#:  [id number] DATE OF BIRTH:  09/09/1975  DATE OF CONSULTATION:  12/06/2011  REFERRING PHYSICIAN:  Dr. Carollee Massed  CONSULTING PHYSICIAN:  Lurline Del, MD  REASON FOR CONSULTATION: Severe jaundice.   HISTORY OF PRESENT ILLNESS: 39 year old male with history of heavy drinking for prolonged period of time. He drinks about nine 16-ounce beer every day. His last drink was yesterday morning. According to the patient he initially decided to come to the hospital for detox as he has been drinking heavily but he also is complaining of upper abdominal pain and a bruise that has developed overnight. Patient is also complaining of some nausea, vomiting, and diarrhea. Overall he has just been feeling very sick for the last couple of days with diffuse abdominal pain, nausea, vomiting, hematoma or bruise in the upper part of the abdomen as well as worsening jaundice. Patient was admitted by Dr. Allena Katz for further management of what appears to be alcoholic hepatitis with some degree of hepatic dysfunction. Ultrasound of the abdomen done in the Emergency Room showed no flow in the portal vein, although CTA was done which showed normal portal vein without any evidence of portal vein thrombosis.   PAST MEDICAL HISTORY: According to him, none.   PAST SURGICAL HISTORY: History of some sort of abdominal surgery.   ALLERGIES: None.   MEDICATIONS: None.   SOCIAL HISTORY: Smokes about 5 to 6 cigarettes a day. Alcohol as mentioned above. Denies any liquor use but uses marijuana every now and then.   FAMILY HISTORY: Unremarkable.   REVIEW OF SYSTEMS: Positive for diffuse abdominal pain, nausea, vomiting, jaundice, bruise on the upper abdomen, weakness and numbness of the left lower extremity. Denies any hematemesis, melena, bright red blood per rectum, fever or chills.    PHYSICAL EXAMINATION: VITAL SIGNS: On initial physical he was tachycardic with a heart rate of about 110,  respirations 24 to 26, blood pressure 106/63. He has been afebrile.   GENERAL: Overall appears to be a very sick person. He has facial edema. He is deeply jaundiced. Multiple bruises were noted especially one on the upper mid abdominal area.   LUNGS: Grossly clear to auscultation bilaterally.   CARDIOVASCULAR: Regular rate and rhythm. No gallops or murmur.   ABDOMEN: Diffusely distended abdomen which is tender to palpation without any rebound or guarding. Liver appears to be somewhat enlarged. No splenomegaly was noted. Ascites is hard to assess because of patient's abdominal distention and tenderness on palpation. Large area of ecchymosis was noted in the epigastric to upper abdominal area.   NEUROLOGIC: He is quite awake and alert.   EXTREMITIES: Trace pedal edema.   LABORATORY, DIAGNOSTIC AND RADIOLOGICAL DATA: Serum ammonia 51, albumin of only 1.4, AST 218. ALT 51, alkaline phosphatase 112, total bilirubin 16.5, direct bilirubin 12.4. Serum lipase 389. TSH normal. Serum ethanol 185, bilirubin has been stable since yesterday. INR was 2.7 yesterday, is 2.2 today. Hemoglobin 12.2, white cell count 8.8, platelet count 72. Ultrasound of the abdomen shows a dense hepatic echo pattern. No hepatic masses. No dilated hepatic ducts. No dilated intra or extrahepatic ducts. Decreased flow was noted in the portal vein as mentioned above. A CTA was performed which showed portal and hepatic veins to be patent. Liver is diffusely enlarged. Gallbladder is mildly distended. Mild pan colonic thickening which may be secondary to low albumin.   ASSESSMENT AND PLAN: Patient with nausea, vomiting, abdominal pain and diarrhea. He may have mild colitis although his white cell  count is normal and he is afebrile. Patient is also presenting with significant jaundice and his AST and ALT ratio is consistent with alcoholic hepatitis. Patient also has low albumin and platelet suggesting either chronic liver disease or cirrhosis  of the liver. His overall clinical picture is very concerning. His INR was up to 2.7, although INR has come down to 2.2 today. I do not believe we are dealing with a case of acute hepatic failure and therefore patient was [facility] and also because of the fact that patient is not a transplant candidate. Patient's ammonia is somewhat high, although clinically he does not appear to be frankly encephalopathic. Recommendations were discussed with Dr. Carollee MassedKaminski and Dr. Allena KatzPatel and include close observation in the hospital. Clear liquid diet, stool studies as ordered. Agree with current antibiotics. Follow LFTs especially PT and INR on a daily basis. Watch for DTs. Acute hepatic panel although again this appears to be case of an acute alcoholic hepatitis with underlying chronic liver disease or cirrhosis. Poor prognosis has been discussed with the patient and his family. Will follow.   ____________________________ Lurline DelShaukat Reyn Faivre, MD si:cms D: 12/06/2011 14:41:17 ET T: 12/06/2011 15:02:10 ET JOB#: [id number]  cc: Lurline DelShaukat Jamison Yuhasz, MD, <Dictator> Lurline DelSHAUKAT Duwayne Matters MD ELECTRONICALLY SIGNED 12/06/2011 18:36

## 2014-10-30 NOTE — Consult Note (Signed)
Chief Complaint:   Subjective/Chief Complaint continues with tender luq, abdominal wall.   VITAL SIGNS/ANCILLARY NOTES: **Vital Signs.:   11-Jun-13 18:48   Vital Signs Type Q 4hr   Temperature Temperature (F) 97.7   Celsius 36.5   Temperature Source Oral   Pulse Pulse 87   Pulse source per vital sign device   Respirations Respirations 20   Systolic BP Systolic BP 093   Diastolic BP (mmHg) Diastolic BP (mmHg) 68   Mean BP 85   Pulse Ox % Pulse Ox % 98   Pulse Ox Activity Level  At rest   Oxygen Delivery Room Air/ 21 %   Brief Assessment:   Cardiac Regular    Respiratory clear BS    Gastrointestinal details normal Bowel sounds normal  No rebound tenderness  No rigidity  ascites, hepatosplenomegally.  marked tenderness of the left abdominus rectus several cm below the costal margin,   Lab Results: Hepatic:  11-Jun-13 04:51    Bilirubin, Total  17.3   Alkaline Phosphatase  146   SGPT (ALT)  148 (12-78 NOTE: NEW REFERENCE RANGE 05/31/2011)   SGOT (AST)  168   Total Protein, Serum 7.0   Albumin, Serum  1.5  Routine Chem:  11-Jun-13 04:51    Glucose, Serum  112   BUN  5   Creatinine (comp)  0.44   Sodium, Serum  134   Potassium, Serum 4.7   Chloride, Serum  97   CO2, Serum 28   Calcium (Total), Serum  7.3   Osmolality (calc) 266   eGFR (African American) >60   eGFR (Non-African American) >60 (eGFR values <56m/min/1.73 m2 may be an indication of chronic kidney disease (CKD). Calculated eGFR is useful in patients with stable renal function. The eGFR calculation will not be reliable in acutely ill patients when serum creatinine is changing rapidly. It is not useful in  patients on dialysis. The eGFR calculation may not be applicable to patients at the low and high extremes of body sizes, pregnant women, and vegetarians.)   Anion Gap 9   Magnesium, Serum 2.2 (1.8-2.4 THERAPEUTIC RANGE: 4-7 mg/dL TOXIC: > 10 mg/dL  -----------------------)  Routine Hem:   11-Jun-13 04:51    WBC (CBC)  20.1   RBC (CBC)  2.52   Hemoglobin (CBC)  10.2   Hematocrit (CBC)  30.5   Platelet Count (CBC) 218   MCV  121   MCH  40.5   MCHC 33.5   RDW  17.7   Neutrophil % 89.7   Lymphocyte % 3.1   Monocyte % 4.8   Eosinophil % 0.0   Basophil % 2.4   Neutrophil #  18.1   Lymphocyte #  0.6   Monocyte # 1.0   Eosinophil # 0.0   Basophil #  0.5 (Result(s) reported on 17 Dec 2011 at 06:31AM.)   Assessment/Plan:  Assessment/Plan:   Assessment 1) luq pain- please see my previous note.  CT film shows possible small left abdominus rectus lesion.  patietn remembers hitting this site while jumping into a truck pta.  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  will need another 7-10 days at current dose then a taper over 2 weeks. discussed etoh cessation/abstinence again.  labs stable.   3) lower extremity edema-secondary to hypoalbuminemia/liver disease. patient has not had a echo, would do this to rule out cardiovascular contribution.    Plan as above.   Electronic Signatures: SLoistine Simas(MD)  (Signed 11-Jun-13 19:02)  Authored: Chief Complaint, VITAL  SIGNS/ANCILLARY NOTES, Brief Assessment, Lab Results, Assessment/Plan   Last Updated: 11-Jun-13 19:02 by Loistine Simas (MD)

## 2014-10-30 NOTE — H&P (Signed)
PATIENT NAME:  Kevin Gardner, PEATROSS MR#:  [id number] DATE OF BIRTH:  1976/06/30  DATE OF ADMISSION:  12/16/2011  PRIMARY CARE PHYSICIAN: Lurline Del, MD   CHIEF COMPLAINT: Increasing lower extremity edema, scrotal edema, and upper abdominal pain.   HISTORY OF PRESENT ILLNESS: The patient is a 39 year old male who was recently hospitalized from May 30th to December 11, 2011. 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 He was discharged on lactulose for hepatic encephalopathy and Nadolol for variceal prophylaxis and prednisone for alcoholic hepatitis. He has been compliant with his medications. He has not drank any alcohol after discharge. He says he has been doing what he is supposed to do. He  presented with increasing lower extremity edema and also complaining of scrotal edema. He denies any shortness of breath or chest pain. He is also complaining of upper abdominal pain. He is mainly complaining of pain in the epigastric area, the left upper quadrant area. He denies any pain on the right side; so, in the Emergency Room he was found to have a bilirubin of 16.9, which has improved from the bilirubin of 19.1 on December 11, 2011. His CT of the abdomen and pelvis was found to have a distended gallbladder with gallbladder wall thickening and pericholecystic fluid and minimal inflammatory stranding in the adjacent pericholecystic fat near the gallbladder neck suspicious for acute cholecystitis. The patient got a dose of meropenem  in the Emergency Room because of the possibility of acute cholecystitis. He denies any nausea or vomiting. He actually says his abdominal pain started yesterday while he was actually coming out of church.  He took his oxycodone, and that eased off a little bit last night. He actually ate a roast beef sandwich for supper and actually the meals are helping his abdominal pain. They are not  exacerbating the abdominal pain. He had some fruit bowl this morning, but he had this abdominal pain again this morning.  He also says there is a knot in the belly. He got one dose of IV Lasix 20 mg in the Emergency Room. He got 0.2 mg of IV Dilaudid, meropenem and clindamycin; and I was asked to admit the patient because of increasing lower extremity edema, scrotal edema, and also abdominal pain-possibility of acute cholecystitis.   REVIEW OF SYSTEMS: CONSTITUTIONAL: He denies any fever or weakness. He is complaining of weight gain of about 6 pounds since discharge. HEENT: No acute change in vision. No headache. No dizziness. RESPIRATORY: No cough. No dyspnea. CARDIOVASCULAR: No chest pain. No shortness of breath. GASTROINTESTINAL: He is complaining of abdominal pain but no nausea, vomiting, hematemesis. No melena. GENITOURINARY: No dysuria. No frequency. MUSCULOSKELETAL: He is complaining of lower extremity edema, also complaining of jaundice or yellowing of his skin and also easy bruisability but no bleeding from any site. He denies any joint pains. NEUROPSYCHIATRIC: No focal numbness or weakness or  anxiety.   PAST MEDICAL HISTORY: Recent admission from May 30th to December 11, 2011 and was found to have alcoholic liver disease with alcoholic hepatitis and alcoholic liver cirrhosis, coagulopathy, thrombocytopenia, hepatic encephalopathy, and he is supposed to follow with Dr. Niel Hummer. During the hospital stay, he had an ultrasound of the abdomen done which showed dense hepatic echo pattern. No hepatic mass. He had a CT angiography of the abdomen done at that time which showed the portal vein and the hepatic veins are patent. The liver is enlarged and consistent with hepatic steatosis. Again on  the CT scan, it showed the gallbladder is mildly distended with some gallbladder wall thickening. He had hepatitis A, B, and C panel done during the last admission. His hepatitis C surface antigen was negative. Total  hepatitis B antibodies were positive. He is hepatitis B immune. Hepatitis C antibody is negative.  PAST SURGICAL HISTORY: He had hernia repair at around 6 or 7.   ALLERGIES TO MEDICATIONS: None.   HOME MEDICATIONS:  (At discharge)  1. Lactulose 30 mL b.i.d.  2. Nadolol 20 mg daily.  3. Oxycodone 10 mg every 6 hours p.r.n.  4. Potassium chloride 20 mEq daily.  5. Prednisone 40 mg daily.  6. Anusol suppository, one application rectal t.i.d.    SOCIAL HISTORY: He is living with his girlfriend. He is trying to quit smoking. He is smoking 2 or 3 cigarettes a day. He has not used any alcohol. No drug use.   FAMILY HISTORY: His mother died of chronic obstructive pulmonary disease and she had diabetes.   PHYSICAL EXAMINATION:  VITAL SIGNS: On exam today, his temperature was 97.6, heart rate 94, respiratory rate 16, blood pressure 125/76, saturating 98% on room air.   GENERAL: He is a young Caucasian male comfortably sitting, asking for food. He has diffuse jaundice.   HEENT: Bilateral pupils are equal. Extraocular muscles are intact. He has deep scleral icterus. No conjunctivitis. Oral mucosa is dry. Mild pallor.   NECK: No thyroid tenderness, enlargement or nodule. Neck is supple. No masses, nontender. No adenopathy. No JVD. No carotid bruits.   CHEST: Bilateral breath sounds are clear. No wheeze. Normal effort. No respiratory distress.   HEART: Heart sounds are regular. No murmur. No gallop. He has significant bilateral lower extremity edema extending up to the thigh area.   ABDOMEN: Abdomen is distended, but  there is no tense ascites.  He has an umbilical hernia. He has hepatosplenomegaly. He has an enlarged liver. The left hepatic lobe is also palpable. It appears to be firm. There is also splenomegaly. I could not appreciate any right upper quadrant tenderness or any Murphy's sign. He has scrotal edema. He has some ecchymosis in the scrotal area but no evidence of any wound or infection.    NEUROLOGIC: He is awake, alert, oriented to time, place, and person.   SKIN: Cranial nerves are intact. He moves all extremities against gravity. The lower extremity is difficult to move because of the lower extremity edema. He has severe lower extremity edema almost extending up to the thighs. He has some ecchymosis there. He has some petechia there.   LABORATORY, DIAGNOSTIC AND RADIOLOGICAL DATA:  His lab work shows that he has a white count of 20.4, hemoglobin of 9.8, and platelet count of 219,000. His platelet count has improved from 127,000. His hemoglobin is in the stable range. His MCV is 120 because of liver disease, he has increased white count maybe because of steroids.  BMP: Sodium 130, potassium 3.7, BUN 6, creatinine 0.55, albumin 1.4, calcium is 7, and his corrected calcium is 9.08. His bicarbonate is 27. His bilirubin is 16.9. His Alkaline phosphatase is 141, ALT 142, AST 175.  His INR has improved to 1.7.  His urinalysis, nitrite and leukocyte esterase is negative.  Bilirubin is 2+.  CT abdomen and pelvis today: Gallbladder is distended with tiny cholelithiasis, gallbladder wall thickening, and pericholecystic fluid, questionable acute cholecystitis and hepatic steatosis.  IMPRESSION:  1. Anasarca, increasing lower extremity edema and scrotal edema secondary to alcoholic liver disease, secondary to  hypoalbuminemia. 2. Abdominal pain with CT of the abdomen suggestive of acute cholecystitis but clinically less likely. This could be secondary to peptic ulcer disease.  3. Alcoholic hepatitis with alcoholic liver disease, on prednisone.  4. Coagulopathy secondary to alcoholic liver disease. 5. Hyponatremia.  6. Elevated liver function tests secondary to alcoholic hepatitis. 7. Macrocytosis secondary to alcoholic liver disease.  8. Leukocytosis secondary to alcoholic hepatitis and also because of steroids.   PLAN: A 39 year old male recently admitted and found to have alcoholic  liver disease with alcoholic hepatitis and some alcoholic cirrhotic pattern also. He has been started on prednisone because his discriminant factor was high. He has stopped using alcohol. His bilirubin is coming down; it was 19 on June 5th and it has come down to 16.9.  He is on nadolol for variceal prophylaxis. He is on lactulose for prevention of hepatic encephalopathy. He has hypoalbuminemia with an albumin of 1.4. He has anasarca with severe lower extremity edema. I am going to start him on some gentle IV Lasix, and I am going to start him on some Aldactone, with alcoholic liver disease. He probably will be needing some diuretics at home also, but his creatinine should be carefully monitored and his sodium should be carefully monitored. His calcium is 7, but his corrected calcium is greater than 9 corrected to albumin. His CT abdomen and pelvis shows some thickening of the gallbladder, pericholecystic fluid, but this can show up with ascites also. It is doubtful this is cholecystitis. The patient has been started on meropenem, which I am going to continue at this time, surgical consult; but he is not a candidate for any surgery. I will also start him on some PPI. I will start him on a full liquid diet. I will get a GI consult on him. His blood cultures have been done. I have told him that he is doing well being compliant with medications and not using alcohol, and he should continue doing that and follow up with Gastroenterology.   TIME SPENT:   Time spent with admission and coordination of care was 55 minutes.   ____________________________ Fredia SorrowAbhinav Saahas Hidrogo, MD ag:cbb D: 12/16/2011 15:53:05 ET T: 12/16/2011 16:59:47 ET JOB#: [id number] cc: Fredia SorrowAbhinav Angelyse Heslin, MD, <Dictator> Lurline DelShaukat Iftikhar, MD Fredia SorrowABHINAV Manna Gose MD ELECTRONICALLY SIGNED 12/25/2011 11:29

## 2014-10-30 NOTE — Consult Note (Signed)
Chief Complaint:   Subjective/Chief Complaint alert and oriented, patient continues with pain in the area of the rectus sheath hematoma.  abdomen still distended.  no n/v or abdominal pain otherwise. edema of scrotum and lower extremities slow improving.   VITAL SIGNS/ANCILLARY NOTES: **Vital Signs.:   12-Jun-13 12:35   Vital Signs Type Q 4hr   Temperature Temperature (F) 98.6   Celsius 37   Temperature Source oral   Pulse Pulse 89   Respirations Respirations 18   Systolic BP Systolic BP 98   Diastolic BP (mmHg) Diastolic BP (mmHg) 70   Mean BP 79   BP Source vital sign device   Pulse Ox % Pulse Ox % 97   Pulse Ox Activity Level  At rest   Oxygen Delivery Room Air/ 21 %   Brief Assessment:   Cardiac Regular    Respiratory clear BS    Gastrointestinal details normal Bowel sounds normal  No rebound tenderness  hepato splenomegaly, tender over left upper rectus abd,   Lab Results: Hepatic:  11-Jun-13 04:51    Bilirubin, Total  17.3   Alkaline Phosphatase  146   SGPT (ALT)  148 (12-78 NOTE: NEW REFERENCE RANGE 05/31/2011)   SGOT (AST)  168   Total Protein, Serum 7.0   Albumin, Serum  1.5  Routine Chem:  11-Jun-13 04:51    Glucose, Serum  112   BUN  5   Creatinine (comp)  0.44   Sodium, Serum  134   Potassium, Serum 4.7   Chloride, Serum  97   CO2, Serum 28   Calcium (Total), Serum  7.3   Osmolality (calc) 266   eGFR (African American) >60   eGFR (Non-African American) >60 (eGFR values <7m/min/1.73 m2 may be an indication of chronic kidney disease (CKD). Calculated eGFR is useful in patients with stable renal function. The eGFR calculation will not be reliable in acutely ill patients when serum creatinine is changing rapidly. It is not useful in  patients on dialysis. The eGFR calculation may not be applicable to patients at the low and high extremes of body sizes, pregnant women, and vegetarians.)   Anion Gap 9   Magnesium, Serum 2.2 (1.8-2.4 THERAPEUTIC  RANGE: 4-7 mg/dL TOXIC: > 10 mg/dL  -----------------------)  Routine Hem:  11-Jun-13 04:51    WBC (CBC)  20.1   RBC (CBC)  2.52   Hemoglobin (CBC)  10.2   Hematocrit (CBC)  30.5   Platelet Count (CBC) 218   MCV  121   MCH  40.5   MCHC 33.5   RDW  17.7   Neutrophil % 89.7   Lymphocyte % 3.1   Monocyte % 4.8   Eosinophil % 0.0   Basophil % 2.4   Neutrophil #  18.1   Lymphocyte #  0.6   Monocyte # 1.0   Eosinophil # 0.0   Basophil #  0.5 (Result(s) reported on 17 Dec 2011 at 06:31AM.)   Assessment/Plan:  Assessment/Plan:   Assessment 1) ETOH hepatopathy-met maddreys criterion on initial hospitalization 2 weeks ago.  still on po steroids.  continues with abdominal distension and hepatosplenomegally.  2) left rectus sheath hematoma.  3) peripheral edema-c/w hypoalbuminemia from liver disease, elevated PT, hyperbilirubinemia    Plan 1) labs stable to minimal improvement.  continue current.  discussed iv albumen  with Dr GLyndel Safe start low dose, 12.5 gm.  also recommend echocardiogram, if normal could try 25 gm later.  currently on aldactone and iv lasix with some improvement of  edema.  have ordered daily weights to assist judgement of fluid status.    I will not be available until monday.  I will ask GI to continue to follow.   Electronic Signatures: Loistine Simas (MD)  (Signed 12-Jun-13 17:07)  Authored: Chief Complaint, VITAL SIGNS/ANCILLARY NOTES, Brief Assessment, Lab Results, Assessment/Plan   Last Updated: 12-Jun-13 17:07 by Loistine Simas (MD)

## 2014-10-30 NOTE — Consult Note (Signed)
PATIENT NAME:  Kevin Gardner, Kevin Gardner MR#:  [id number] DATE OF BIRTH:  [birth year]  DATE OF CONSULTATION:  12/07/2011  REFERRING PHYSICIAN:   CONSULTING PHYSICIAN:  Christena DeemMartin U. Skulskie, MD  CONSULT FOLLOW-UP NOTE  SUBJECTIVE: The patient denies nausea, continues with generalized abdominal pain and some distention, this mostly right upper quadrant. No emesis.   PHYSICAL EXAMINATION:  VITAL SIGNS: Temperature 97.6, pulse 105, respirations 20, blood pressure 104/72, pulse oximetry 93% on room air.   The patient had a loose stool early this morning with some blood. He has subsequently had another loose stool showing no blood. It is noted the patient was given 2 units of FFP and hemoglobin has been relatively stable over the course of the day.   HEART: Regular rate and rhythm.   LUNGS: Bilaterally clear.   ABDOMEN: Distended, positive ascites. Bowel sounds positive. Possible mild peritoneal signs interpreted as peritoneal irritation secondary to the ascites. The patient is on antibiotics for SBP prophylaxis.   LABORATORY DATA: Laboratories today show a hemoglobin at 10.3 at 07:11. It was 10.9 last night. Glucose 94, BUN 7, creatinine 0.52, sodium 132, potassium 3.6, chloride 97, bicarbonate 27, calcium 7.3, total bilirubin 19.3, alkaline phosphatase 113, ALT 62, AST 198, total protein 6.7, albumin 1.6. Ethanol on admission was 0.185. TSH on admission was 2.24. Magnesium level today was 2.1. Acetaminophen had been previously checked, normal/low.   The patient had a CT scan angiography without contrast on admission to the hospital due to ultrasound finding of possible portal thrombosis. This study showed the portal vein and hepatic veins to be patent, the liver diffusely enlarged, and density consistent with hepatic steatosis, mild stranding extending inferiorly from the liver, nonspecific, possibly related to the hepatitis.   Gallbladder mildly distended, mild gallbladder wall thickening,  nonspecific. Gallbladder wall thickening can be seen in cholecystitis as well as in the setting of hepatitis.  Mild thickening of pan colonic wall possibly related to under distention. Colitis would be in the differential. Correlate clinically.   Prior to that CT he had an abdominal ultrasound showing hepatic echo pattern density. No hepatic mass. No dilated intrahepatic ducts. The common bile duct measured 4.5 mm, within normal limits. Decreased flow noted in the portal vein with biphasic movement. Concern for portal thrombus as noted above. Large amount of sludge in the gallbladder. Nonspecific thickening of the gallbladder wall, but no gallbladder stones identified.   ASSESSMENT: Alcohol-related hepatopathy. The patient presented with a significant Maddrey  discriminant function and is currently on steroids. He presented with coagulopathy, hyperammonemia, ascites, and tender hepatopathy. PT is elevated. Mortality risk is significant. Acute hepatitis panels were negative as is the chronic hepatitis panel. No ductal dilatation or evidence of obstruction.   RECOMMENDATION:  The patient is currently being treated for severe alcohol-related hepatopathy. He is at significant risk for progressive acute liver failure. He is not a candidate for transplant due to his ongoing alcohol abuse. Encouraging is the fact that his creatinine has been stable although the total bilirubin is increasing. His PT has shown some improvement.  Agree with current management. Recheck labs in the morning including a proTime. Following.    ____________________________ Christena DeemMartin U. Skulskie, MD mus:bjt D: 12/07/2011 22:44:32 ET T: 12/08/2011 11:23:15 ET JOB#: [id number]  cc: Christena DeemMartin U. Skulskie, MD, <Dictator> Christena DeemMARTIN U SKULSKIE MD ELECTRONICALLY SIGNED 12/10/2011 17:17

## 2014-10-30 NOTE — Discharge Summary (Signed)
PATIENT NAME:  Kevin Gardner, Kevin Gardner MR#:  [id number] DATE OF BIRTH:  02-May-1976  DATE OF ADMISSION:  12/05/2011 DATE OF DISCHARGE:  12/11/2011  PRIMARY CARE PHYSICIAN:  Will be Dr. Niel Hummer. [facility] would not see him.   FINAL DIAGNOSES:  1. Abdominal pain.  2. Alcoholic liver cirrhosis.  3. Alcoholic hepatitis.  4. Jaundice with elevated liver function tests.  5. Coagulopathy.  6. Thrombocytopenia.  7. Malnutrition.  8. Tachycardia.  9. Alcohol abuse.  10. Hemorrhoids with rectal bleeding.  11. Hypokalemia.  12. Hepatic encephalopathy.   MEDICATIONS ON DISCHARGE:  1. Nadolol 20 mg p.o. nightly.  2. Lactulose 30 mg p.o. twice a day. 3. Anusol-HC cream, one application rectally 3 times a day. 4. Potassium chloride 20 mEq p.o. daily.  5. Prednisone 20 mg 2 tablets daily until seen by Dr. Niel Hummer, then his further instructions after that.  6. Oxycodone 10 mg every six hours as needed for pain. 7. Omeprazole 20 mg daily.  Do not take alcohol.  Do not take Tylenol.   DIET: Low sodium diet.   ACTIVITY: Activity as tolerated.   FOLLOWUP: Follow up with Dr. Niel Hummer in one week.   REASON FOR ADMISSION: The patient was admitted 12/05/2011 and discharged 12/11/2011. The patient was here for detox but also complaining of being jaundiced and having abdominal pain, nausea, and vomiting.   HISTORY OF PRESENT ILLNESS: 39 year old man with history of drinking came in with nausea, vomiting, abdominal pain, bruise on the abdomen, being jaundiced, admitted with new onset jaundice, most likely alcohol-induced hepatitis.   CONSULTATIONS: Gastroenterology consultation was done by Dr. Niel Hummer.   LABORATORY, DIAGNOSTIC, AND RADIOLOGICAL DATA: Urine culture was negative. Urine toxicology positive for cannabis. Bilirubin direct 11.8. Lipase 389. Urinalysis 2+ bacteria otherwise negative. 1+ blood, 2+ bilirubin, TSH 2.24, alcohol 185 mg/dL, glucose 04.5, BUN 5, creatinine 0.52, sodium  122, potassium 2.6, chloride 81, CO2 30, calcium 17, total bilirubin 16.4. Liver function tests: AST elevated at 232, albumin low at 1.5. White blood cell count 10.5, hemoglobin and hematocrit 12.2 and 34.2, platelet count 91. EKG showed sinus tachycardia. Ultrasound of the abdomen showed hepatic echo pattern is dense, no hepatic mass. Decreased flow in the portal vein. Large amount of sludge in the gallbladder. INR was 2.7. Hepatitis A, B, and C panel showed positive hepatitis A antibody but negative hepatitis B and C. Blood cultures negative. CT angiogram of the abdomen and pelvis with and without contrast showed portal vein and hepatic veins patent. Liver is diffusely enlarged, density consistent with hepatic steatosis. Gallbladder mildly distended with gallbladder wall thickening, nonspecific. Mild thickening of the pan colonic wall. Ammonia was 51. Fresh frozen plasma was transfused 2 units on the 31st. INR on the second was 2. Last chemistry showed a glucose of 84, BUN 8, creatinine 0.59, sodium 135, potassium 3.6, chloride 100, CO2 27, calcium 7, total bilirubin 19.1, bilirubin, direct 14.3, alkaline phosphatase 122, ALT 127, AST 195. Albumin low at 1.5. Last platelet count  was 127. Last hemoglobin 10.3. White blood cell count 17.1. Last INR was 2.2. Last ammonia was 78.   HOSPITAL COURSE PER PROBLEM LIST:  1. I think all the patient's issues are secondary to alcohol. His abdominal pain was initially thought to be spontaneous bacterial peritonitis. He was given antibiotic course throughout the hospital stay. No change in his pain with that. He also does have hepatomegaly, which could be causing his pain. Pain medication prescribed on discharge. 2. Alcohol liver cirrhosis: Currently  the patient is not a candidate for transplant with recent history of alcohol. Needs to sustain from alcohol for at least six months to be a candidate. Close followup with Dr. Niel HummerIftikhar as outpatient. [facility].  3. Alcoholic hepatitis: He was prescribed prednisone 40 mg daily until seen by Dr. Niel HummerIftikhar next week and then taper can start from there. 4. Jaundice and elevated liver function tests, believed secondary to cirrhosis. 5. Coagulopathy secondary to cirrhosis did not correct with FFP or vitamin K.  6. Thrombocytopenia secondary to his cirrhosis.  7. Malnutrition secondary to liver disease. 8. Tachycardia: The patient was given nadolol, heart rate ranging between 82 and 106 upon discharge. Blood pressure 113/75. 9. Alcohol abuse: He was put on CIWA protocol. He did have slight tremors during the hospital stay, but went through the withdrawal process unremarkably and was discharged home without any medications for this.  10. Hemorrhoids and rectal bleeding: He was given Anusol cream. Hemoglobin remained stable.  11. Hypokalemia: This was replaced pretty aggressively during the hospital stay and oral supplementation upon discharge.  12. Hepatic encephalopathy: He was given lactulose. Mental status stayed pretty good, but ammonia level was high. The patient was having multiple bowel movements per day, lactulose prescribed.  FOLLOWUP: Follow up with Dr. Niel HummerIftikhar is needed. He will follow up and most [facility]. Again, [facility] will not see him, so Dr. Niel HummerIftikhar will serve as his medical doctor at this point. I explained to the patient it is a matter of life and death. He has to quit alcohol at all costs. Close clinical followup is needed.    TIME SPENT ON DISCHARGE: 35 minutes.   ____________________________ Herschell Dimesichard J. Renae GlossWieting, MD rjw:bjt D: 12/11/2011 17:16:00 ET T: 12/12/2011 10:59:29 ET JOB#: [id number]  cc: Herschell Dimesichard J. Renae GlossWieting, MD, <Dictator> Lurline DelShaukat Iftikhar, MD Salley ScarletICHARD J Ayame Rena MD ELECTRONICALLY SIGNED 12/12/2011 17:28

## 2014-10-30 NOTE — Consult Note (Signed)
Chief Complaint:   Subjective/Chief Complaint Ptaient overall feels better but constantly asking for pain medicines for pain all over the body. Complaining of frequent bowel movements.   VITAL SIGNS/ANCILLARY NOTES: **Vital Signs.:   03-Jun-13 18:23   Vital Signs Type Q 4hr   Temperature Temperature (F) 98.4   Celsius 36.8   Temperature Source oral   Pulse Pulse 96   Pulse source per Dinamap   Respirations Respirations 20   Systolic BP Systolic BP 003   Diastolic BP (mmHg) Diastolic BP (mmHg) 66   Mean BP 81   BP Source vital sign device   Pulse Ox % Pulse Ox % 94   Pulse Ox Activity Level  At rest   Oxygen Delivery Room Air/ 21 %   Brief Assessment:   Additional Physical Exam Jaundiced. Positive bilateral pedal edema. Positive extensive bruising of abdominal wall.   Lab Results: Hepatic:  03-Jun-13 07:00    Bilirubin, Direct  14.20 (Result(s) reported on 09 Dec 2011 at 08:36AM.)   Bilirubin, Total  18.9   Alkaline Phosphatase 110   SGPT (ALT)  99 (12-78 NOTE: NEW REFERENCE RANGE 05/31/2011)   SGOT (AST)  209   Total Protein, Serum 6.4   Albumin, Serum  1.5  Routine Chem:  03-Jun-13 07:00    Ammonia, Plasma  78 (Result(s) reported on 09 Dec 2011 at 08:39AM.)   Magnesium, Serum 1.8 (1.8-2.4 THERAPEUTIC RANGE: 4-7 mg/dL TOXIC: > 10 mg/dL  -----------------------)   Glucose, Serum  101   BUN 8   Creatinine (comp)  0.45   Sodium, Serum  134   Potassium, Serum  3.1   Chloride, Serum 100   CO2, Serum 25   Calcium (Total), Serum  7.0   Osmolality (calc) 267   eGFR (African American) >60   eGFR (Non-African American) >60 (eGFR values <54m/min/1.73 m2 may be an indication of chronic kidney disease (CKD). Calculated eGFR is useful in patients with stable renal function. The eGFR calculation will not be reliable in acutely ill patients when serum creatinine is changing rapidly. It is not useful in  patients on dialysis. The eGFR calculation may not be  applicable to patients at the low and high extremes of body sizes, pregnant women, and vegetarians.)   Result Comment CALCIUM - RESULTS VERIFIED BY REPEAT TESTING.  - NOTIFIED OF CRITICAL VALUE  - C/WYNETTE MILES 049176/3/13 KMR  - READ-BACK PROCESS PERFORMED.  Result(s) reported on 09 Dec 2011 at 08:21AM.   Anion Gap 9  Routine Hem:  03-Jun-13 07:00    Hemoglobin (CBC)  10.0 (Result(s) reported on 09 Dec 2011 at 07:55AM.)   Assessment/Plan:  Assessment/Plan:   Assessment Cirrhosis of liver. ETOH hepatitis with severe jaundice. Generalized anasarca secondary to severe hypoalbuminemia. Coagulopathy, stable. Thrombocytopenia, stable. Encephalopathy. Clinically none. Ammonia is improving. Positive tremors most likely withdrawl. Gallbladder sludge without radiological evidence of biliary obstruction.    Plan Continue supportive care and steroids. Repeat labs in am. Probable DC in 1-2 days. Discussed with him. Will follow as OP.   Electronic Signatures: IJill Side(MD)  (Signed 03-Jun-13 19:42)  Authored: Chief Complaint, VITAL SIGNS/ANCILLARY NOTES, Brief Assessment, Lab Results, Assessment/Plan   Last Updated: 03-Jun-13 19:42 by IJill Side(MD)

## 2014-10-30 NOTE — Consult Note (Signed)
Chief Complaint:   Subjective/Chief Complaint Covering for Dr. Gustavo Lah. Abd pain persists, esp over where hematoma present. Eating food without complications. Legs still edematous. Unable to stand and ambulate comfortably.   VITAL SIGNS/ANCILLARY NOTES: **Vital Signs.:   13-Jun-13 13:25   Vital Signs Type Q 4hr   Temperature Temperature (F) 97.7   Celsius 36.5   Temperature Source oral   Pulse Pulse 85   Respirations Respirations 20   Systolic BP Systolic BP 756   Diastolic BP (mmHg) Diastolic BP (mmHg) 70   Mean BP 84   BP Source vital sign device   Pulse Ox % Pulse Ox % 95   Pulse Ox Activity Level  At rest   Oxygen Delivery Room Air/ 21 %   Brief Assessment:   Cardiac Regular    Respiratory clear BS    Gastrointestinal diffuse upper abd tenderness    Additional Physical Exam Signif edema and some erythema of legs/feet   Lab Results: Hepatic:  13-Jun-13 05:02    Bilirubin, Total  11.8   Alkaline Phosphatase 122   SGPT (ALT)  133 (12-78 NOTE: NEW REFERENCE RANGE 05/31/2011)   SGOT (AST)  136   Total Protein, Serum  6.2   Albumin, Serum  1.4  Routine Chem:  13-Jun-13 05:02    Glucose, Serum  122   BUN 9   Creatinine (comp) 0.65   Sodium, Serum 136   Potassium, Serum  3.3   Chloride, Serum 100   CO2, Serum 28   Calcium (Total), Serum  7.1   Osmolality (calc) 272   eGFR (African American) >60   eGFR (Non-African American) >60 (eGFR values <4m/min/1.73 m2 may be an indication of chronic kidney disease (CKD). Calculated eGFR is useful in patients with stable renal function. The eGFR calculation will not be reliable in acutely ill patients when serum creatinine is changing rapidly. It is not useful in  patients on dialysis. The eGFR calculation may not be applicable to patients at the low and high extremes of body sizes, pregnant women, and vegetarians.)   Anion Gap 8   Assessment/Plan:  Assessment/Plan:   Assessment Pancreatitis. LFT elevation.  Resolving. Able to tolerate food. Hematoma causing pain. Will take time for resolution. Signif periph edema.    Plan Consider increasing aldactone/lasix doses. Continue to moniter LFT and WBC. Thanks.   Electronic Signatures: OVerdie Shire(MD)  (Signed 13-Jun-13 14:01)  Authored: Chief Complaint, VITAL SIGNS/ANCILLARY NOTES, Brief Assessment, Lab Results, Assessment/Plan   Last Updated: 13-Jun-13 14:01 by OVerdie Shire(MD)

## 2014-10-30 NOTE — Discharge Summary (Signed)
PATIENT NAME:  Kevin Gardner, Kevin Gardner MR#:  [id number] DATE OF BIRTH:  25-Aug-1975  DATE OF ADMISSION:  12/16/2011 DATE OF DISCHARGE:  12/22/2011  PRIMARY CARE PHYSICIAN: None at the time of admission. The patient is being referred to the [facility] for establishment and continuation of medical care.   REASON FOR ADMISSION: Lower extremity and scrotal edema as well as some abdominal pain.   DISCHARGE DIAGNOSES:  1. Anasarca due to hypoalbuminemia from alcoholic liver disease. 2. Alcoholic liver disease. 3. Coagulopathy due to liver disease.  4. Rectus sheath hematoma due to coagulopathy.  5. Recent acute alcoholic hepatitis.  6. History of alcohol abuse.  7. Alcoholic liver cirrhosis. 8. History of thrombocytopenia. 9. History of hepatic encephalopathy.  10. Cirrhosis, likely felt to be Child's Class C clinically. 11. Cholelithiasis, less likely felt to be acute cholecystitis, per surgeon's evaluation and the patient also felt to be a very poor surgical candidate.  12. Leukocytosis, felt to be steroid induced as the patient is currently on prednisone. 13. Chronic anemia and thrombocytopenia likely due to chronic liver disease and liver cirrhosis. 14. Ascites.  CONSULTANTS: 1. Barnetta Chapel, MD - Gastroenterology. 2. Tiney Rouge, MD - Surgery.  DISCHARGE DISPOSITION: Home.   DISCHARGE MEDICATIONS: 1. Lasix 40 mg p.o. every 12 hours. 2. Aldactone 25 mg p.o. twice a day. 3. Potassium chloride 40 mEq p.o. daily.  4. Protonix 40 mg p.o. twice a day. 5. Lactulose 10 grams/15 mL take 30 mL p.o. daily. 6. Prednisone 40 mg p.o. daily until seen by Dr. Lurline Del.  7. Oxycodone 5 mg p.o. every 12 hours p.r.n. pain, #10 dispensed with no refills. 8. Thiamine 100 mg p.o. daily x5 days.  9. Folate 1 mg p.o. daily x5 days.  10. Multivitamin one tablet p.o. daily.  DISCHARGE CONDITION: Improved, stable.  DISCHARGE ACTIVITY: As tolerated.  DISCHARGE DIET: Low sodium,  hepatic.  DISCHARGE INSTRUCTIONS:  1. Take medications as prescribed.  2. Return to the emergency department for recurrence of symptoms or for worsening swelling or confusion or for abdominal pain, fever, or chills or for any bleeding complications or dark or bloody stools.  3. Do not consume any alcohol.  FOLLOWUP INSTRUCTIONS: 1. Followup with the [facility] within 1 to 2 weeks. The patient needs repeat CBC, LFTs, and PT/INR within one week. 2. Followup with Dr. Lurline Del within one week. The patient needs repeat LFTs and PT/INR within one week at the [facility] and then sent to Dr. Marlan Palau office.   PROCEDURES: CT of the abdomen and pelvis with contrast, 12/16/2011: Gallbladder distended, tiny cholelithiasis, gallbladder wall thickening, pericholecystic fluid, and minimal inflammatory stranding in the adjacent pericolic fat near the gallbladder neck and findings which may represent acute cholecystitis. There is also hepatic steatosis. Left rectus sheath is expanded with heterogenous high attenuation material likely representing a left rectus sheath hematoma.   2D echocardiogram on 12/19/2011: Normal LVH. There is LVH, mild MR and TR.   PERTINENT LABS/STUDIES: Sodium 130 on admission and 138 at the time of discharge. LFTs on admission with total protein 6.6, albumin 1.4, total bilirubin 16.9, AST 175, ALT 142, and alkaline phosphatase 141. LFTs on the day of discharge, total protein 6.4, albumin 1.5, total bilirubin 10.4, AST 159, ALT 157, and alkaline phosphatase 118.   CBC on admission: WBC 28.4, hemoglobin 9.8, hematocrit 29, and platelets 219. CBC on the day of discharge: WBC 23.7, hemoglobin 9.4, hematocrit 27.9, platelets 169, and MCV 120. INR 1.7  on admission and 1.8 on the day of discharge.   Blood cultures x2 from 12/16/2011: No growth to date.   Renal function normal on admission and discharge.   BRIEF HISTORY/HOSPITAL COURSE: The patient is a 39 year old male  with past medical history of alcohol abuse and resultant chronic alcoholic liver disease and resultant cirrhosis with portal hypertension, hypoalbuminemia, coagulopathy, anemia, thrombocytopenia, hepatic cephalopathy and complications related to alcoholic cirrhosis as well as recent hospitalization for acute alcoholic hepatitis who presented to the emergency department with complaints of worsening lower extremity as well as scrotal edema as well as some abdominal pain. Please see the dictated admission history and physical for pertinent details surrounding the onset of this hospitalization and please see below for further details.   The patient had anasarca with lower extremity and scrotal edema secondary to hypoalbuminemia caused by alcoholic liver disease with resultant abdominal pain likely from abdominal distention and ascites. CT of the abdomen and pelvis was obtained in the ER which was suggestive of possible acute cholecystitis and revealed a left rectus sheath hematoma. Initially the patient was admitted to the hospital and placed on diuretic therapy for his anasarca which led to significant improvement. He was also placed on pain medications. Surgical consultation was obtained with Dr. Michela Pitcher for possible acute cholecystitis as he was also noted to have tiny gallstones, but Dr. Michela Pitcher felt that acute cholecystitis was less likely. Dr. Michela Pitcher reviewed the patient's CT scan and compared it to his previous CT scan. The patient's gallbladder had not significantly changed and the patient was noted to have more ascites at this time as well as a large abdominal hematoma and Dr. Michela Pitcher did not see any indication for acute cholecystitis and felt the gallbladder wall thickening was related to the patient's liver insufficiency. The patient was noted to have hyperbilirubinemia, low albumin, coagulopathy, elevated ammonia, and ascites and he was felt to be Child's Class C clinically and felt to be a very poor surgical candidate  overall and Dr. Michela Pitcher does not feel that there are any acute indications for surgery. Gastroenterology was also consulted for the patient's alcoholic liver disease and recently diagnosed acute alcoholic hepatitis. Overall with diuresis, the patient's clinical condition has significantly improved with improvement of his lower extremity and scrotal edema as well as his ascites and improvement of his abdominal pain. In regards to the patient's rectus sheath hematoma, clinically it was felt to not have expanded as his pain had improved and his hemoglobin had remained stable overall. He does have mild coagulopathy, but since the patient's condition had improved, we did not feel the patient required any reversal of his coagulopathy. Hopefully, the patient's rectus hematoma will slowly resolve on its own and the patient will need close outpatient follow up with gastroenterology as well as primary care physician newly established with the [facility]. In regards to recently diagnosed acute alcoholic hepatitis. The patient will continue prednisone until he is seen by gastroenterology as an outpatient. He is still with abnormal liver function tests in the setting of liver cirrhosis and recently diagnosed with acute alcoholic hepatitis. His hyperbilirubinemia has improved. His transaminases are about the same. He is still with significant hypoalbuminemia and is at high risk for volume overload and therefore will continue diuretics as an outpatient. He was noted to have hypokalemia which is felt to be a Lasix effect and the patient will be discharged on potassium supplement while he is taking Lasix. He does have significant macrocytic anemia likely from B12  and folate deficiency from alcohol abuse and will be placed on B12 and folate supplementation as an outpatient. Leukocytosis is felt to be steroid induced as was recently diagnosed alcoholic hepatitis and the patient has remained afebrile throughout the entirety of this  hospitalization and recommend repeat WBC to be checked by the patient's primary care physician within one week. For tobacco abuse, the patient was strongly counseled about the importance of smoking cessation. As above, with pain control and diuresis, the patient's overall clinical condition has significantly improved and he was actually without any abdominal pain on the day of discharge and overall his anasarca has significantly improved. The patient stated he felt well on 12/26/2011 and was requesting to go home and he was medically and hemodynamically stable and felt to be stable for discharge home with close outpatient follow-up to which the patient was agreeable.   TIME SPENT ON DISCHARGE: Greater than 30 minutes.  ____________________________ Elon AlasKamran N. Deontay Ladnier, MD knl:slb D: 12/26/2011 21:46:18 ET    T: 12/27/2011 11:28:02 ET       JOB#: [id number] cc: Elon AlasKamran N. Feiga Nadel, MD, <Dictator> [facility] Laurier NancyShaukat A. Khan, MD Elon AlasKAMRAN N Aubryana Vittorio MD ELECTRONICALLY SIGNED 01/07/2012 1:23

## 2014-10-30 NOTE — Consult Note (Signed)
Chief Complaint:   Subjective/Chief Complaint Remains awake and alert. INR is somewhat better. Bilirubin stable at 16.5. Ammonia is 51 without signs of clinical encephalopathy.  A/P: ETOH hepatitis with underlying chronic liver disease and/or cirrhosis. INR is stable.        Increased ammonia without frank encephalopathy.        Coagulpathy and thrombocytopenia.        Gallbladder sludge without signs of biliary obstruction.        Abdominal wall hematoma.  Continue supportiva care. Continue IV antibiotics. Clear liquid diet. Advance as tolerated to low salt diet. Agree with steroids. Lactulose 30 ml bid. Follow results of stool studies and hepatitis panel. Dr. Ricki RodriguezSkulski will follow him over the weekend.   Electronic Signatures: Lurline DelIftikhar, Akilah Cureton (MD)  (Signed [phone]-May-13 14:48)  Authored: Chief Complaint   Last Updated: 31-May-13 14:48 by Lurline DelIftikhar, Jyll Tomaro (MD)

## 2014-10-30 NOTE — Consult Note (Signed)
Chief Complaint:   Subjective/Chief Complaint Overall same.  H and H stable. Bilirubin is slightly higher. No new complaints except bilateral lower extremity edema.  Recommendations:  Agree with preparation for discharge. He should follow up with me next week so his LFT's can be followed closely. Continue lactulose as OP. Continue steroids. Salt and water restriction.   Electronic Signatures: Lurline DelIftikhar, Mohmmad Saleeby (MD)  (Signed 04-Jun-13 18:45)  Authored: Chief Complaint   Last Updated: 04-Jun-13 18:45 by Lurline DelIftikhar, Shanyiah Conde (MD)

## 2014-10-30 NOTE — Consult Note (Signed)
  Electronic Signatures: Lurline DelIftikhar, Bernadene Garside (MD)  (Signed 31-May-13 11:30)  Authored: Chief Complaint   Last Updated: 31-May-13 11:30 by Lurline DelIftikhar, Karolynn Infantino (MD)

## 2014-10-30 NOTE — H&P (Signed)
PATIENT NAME:  Kevin Gardner, Kevin Gardner MR#:  [id number] DATE OF BIRTH:  09-Aug-1975  DATE OF ADMISSION:  12/05/2011  PRIMARY CARE PHYSICIAN: None  ED REFERRING PHYSICIAN: Dr. Carollee Massed   CHIEF COMPLAINT: The patient is actually here for detox but he was also complaining of being jaundiced, also abdominal pain, distention, nausea, and vomiting.   HISTORY OF PRESENT ILLNESS: The patient is a 39 year old white male who has a history of drinking for a prolonged period of time, but over the past four years since his mother passed away from chronic obstructive pulmonary disease he has been drinking heavily. He drinks about nine 16-ounce beers on a daily basis. His last drink was this a.m. The patient reports he initially came in because of detox but also was complaining of epigastric, abdominal pain which started a few days ago. He describes it as a sharp type of epigastric pain. He also has been having nausea and vomiting. He reports that the emesis is mostly greenish but there may have been some blood; he is not 100% sure. He also has been having diarrhea. He denies any blood in the diarrhea. The patient also noticed yesterday that he developed bruising below his umbilicus which was new. Also today family noticed he was jaundiced here in the Emergency Department.  The patient in the ED was noted to have a bilirubin that was 16.4. Due to concern for acute liver failure, we requested the ED physician discuss with the GI doctor who was on call regarding appropriate need to stay in this hospital. Dr. Niel Hummer felt that the patient would be appropriate for here; therefore I am asked to admit the patient. The patient currently is complaining of having abdominal pain, sharp, epigastric. Also complains of having hiccups. Complains of feeling nauseous. Denies any fevers or chills. His family notes that he is jaundiced. He reports he is not aware of being jaundiced. He denies any previous problem with his liver.   PAST  MEDICAL HISTORY: None.   PAST SURGICAL HISTORY:  History of abdominal surgery at the age of 6 to 77.   ALLERGIES: None.   MEDICATIONS: He denies any medication use including any over-the-counter NSAIDs or Tylenol use.   SOCIAL HISTORY: Smokes 5 to 6 cigarettes a day. Alcohol-Drinks 9-10 16-ounce beers daily. Denies any liquor. Drugs- uses marijuana from time to time.   FAMILY HISTORY: Mother died from severe chronic obstructive pulmonary disease. There is alcoholism on the mother's side of the family.   REVIEW OF SYSTEMS: CONSTITUTIONAL: Denies any fevers. Complains of fatigue, weakness, and epigastric pain. No weight loss. Complains of weight gain. EYES: No blurred or double vision. No pain. No redness. No inflammation. Complains of jaundice. ENT: No tinnitus. No ear pain. No hearing loss. No allergies, seasonal or year-round. No dentures. No redness of the oropharynx. No difficulty swallowing. RESPIRATORY: No cough. No wheezing. No hemoptysis. No chronic obstructive pulmonary disease. No tuberculosis. No pneumonia. CARDIOVASCULAR: No chest pain. No orthopnea. No edema. No arrhythmia. GASTROINTESTINAL: Complains of nausea, vomiting, and diarrhea. Complains of epigastric pain. Questionable hematemesis. No melena. No gastroesophageal reflux disease. No irritable bowel syndrome. GENITOURINARY: Denies any dysuria, hematuria, renal calculi or frequency. ENDO: Denies any polydipsia, nocturia, or thyroid problems. No increase in sweating, heat or cold intolerance. HEME/LYMPH: Denies any anemia, easy bruisability or bleeding.  SKIN: No acne. No rash. No changes in mole, hair, or skin. MUSCULOSKELETAL: No pain in the neck, back, or shoulder. NEURO: No numbness. No weakness. No cerebrovascular accident. No transient  ischemic attack. PSYCHIATRIC: No anxiety. No insomnia. No ADD. No OCD. No bipolar.   PHYSICAL EXAMINATION:  VITAL SIGNS: Temperature 98, pulse 111, respirations 26, blood pressure 106/63.    GENERAL: The patient appears critically ill with jaundice.   HEENT: Head atraumatic, normocephalic. Pupils equally round, reactive to light and accommodation. There is scleral icterus. There is no conjunctival pallor.  Nasal exam shows no drainage or ulceration.  Oropharynx is clear without any erythema. Face- he has some generalized swelling on his face.   NECK: No thyromegaly. No carotid bruits.   CARDIOVASCULAR: Regular rate and rhythm, tachycardic. No murmurs, rubs, clicks, or gallops.   ABDOMEN: Distended. There is some tenderness in the epigastric region. There is no guarding, no rebound.  He has some bruising in the epigastric region consistent with possible Cullen sign.  EXTREMITIES:  There is no clubbing, cyanosis, or edema.   SKIN: He is jaundiced. No other rash.   VASCULAR: Good DP, PT pulses.   LYMPH NODES: No lymph nodes palpable.   NEUROLOGICAL: Awake, alert, oriented times three. No focal deficits.   VASCULAR: Good DP, PT pulses.   PSYCHIATRIC: Not anxious or depressed.  EVALUATIONS IN THE ED: Ultrasound of the abdomen shows hepatic echo pattern is dense. No hepatic mass, no dilated intrahepatic bile ducts. Decreased flow noted in the portal vein. Signal appears biphasic, possibly of portal thrombus cannot be ruled out. There is a large amount of sludge in the gallbladder. There is nonspecific thickening of the gallbladder wall. No gallbladder stones noted. Glucose 92, BUN 5, creatinine 0.52, sodium 122, potassium 2.6, chloride 81, CO2 30, calcium 7, lipase 389, alcohol percent 0.185. LFTs: Total protein 7.2, albumin 1.5, bilirubin total is 16.4. Bilirubin direct is 11.8, AST 232. TSH 2.24. TUDS negative except for THC. WBC 10.5, hemoglobin 12.2, platelet count 91. Urinalysis shows 2+ bacteria, only 8 WBCs.   ASSESSMENT AND PLAN: The patient is a 39 year old white male who initially came to the hospital for detox. Also complains of abdominal pain, abdominal distention,  nausea and vomiting, noted to have elevated bilirubin. The case has been discussed by the ED physician with Dr. Niel Hummer as well as me. He is okay with the patient coming to hospital for being admitted here.  1. New onset jaundice: Likely due to acute alcohol-induced hepatitis, also could have underlying liver cirrhosis. He does have thrombocytopenia. His coags are currently not available, which I will order. There is also concern for possible portal vein thrombosis. We will get an MRA of the abdomen.  2. Possible portal vein thrombosis: We will get an MRA of the abdomen. There is concern that he may have some bleeding so it may be hard to treat the portal vein thrombosis. However, this does need to be ruled out.  3. Abdominal pain, distention, nonspecific gallbladder sludge and gallbladder wall thickening: There is no ascites noted on ultrasound of his abdomen. This could all be related to acute liver failure. At this time I will go ahead and put him on empiric IV antibiotics. We will ask GI to see.  4. Nausea, vomiting, with questionable hematemesis. We will follow hemoglobin and hematocrit, place him on PPIs b.i.d. If he does have an episode of hematemesis I will place him on octreotide drip.  5. Alcohol abuse: We will place him on CIWA protocol.  6. Hyponatremia and hypokalemia:  All may be related to his volume status, could be related to liver cirrhosis.  I am going to go ahead and give  him IV fluids for the time being, follow his sodium. 7. The patient is critically ill based on his current presentation. I have discussed with the family regarding guarded prognosis.    TIME SPENT: 40 minutes Critical Care time.  ____________________________ Lacie ScottsShreyang H. Allena KatzPatel, MD shp:bjt D: 12/05/2011 15:33:12 ET T: 12/05/2011 16:17:46 ET JOB#: [id number]  cc: Lenford Beddow H. Allena KatzPatel, MD, <Dictator> Charise CarwinSHREYANG H Elihu Milstein MD ELECTRONICALLY SIGNED 12/06/2011 14:02

## 2014-10-30 NOTE — Consult Note (Signed)
Chief Complaint:   Subjective/Chief Complaint Nurse reported two bloody bowel movements with blood mixed with stool. State CBC has been drawn. Nurse has been asked to transfuse 2 units of FFP and call Dr. Mitzi HansenMoody with the result of CBC. Nurse to call Dr. Ricki RodriguezSkulski if any further signs of bleeding.   Electronic Signatures: Lurline DelIftikhar, Kohen Reither (MD)  (Signed [id number] [id number]-May-13 16:50)  Authored: Chief Complaint   Last Updated: 31-May-13 16:50 by Lurline DelIftikhar, Inda Mcglothen (MD)

## 2014-10-30 NOTE — Consult Note (Signed)
PATIENT NAME:  Kevin Gardner, Kevin Gardner MR#:  [id number] DATE OF BIRTH:  03-Jul-1976  DATE OF CONSULTATION:  12/16/2011  REFERRING PHYSICIAN:   CONSULTING PHYSICIAN:  Quentin Ore III, MD  PRIMARY CARE PHYSICIAN: None GI PHYSICIAN: Dr. Niel Hummer  ADMITTING PHYSICIAN: [facility]  CHIEF COMPLAINT: Abdominal swelling.   BRIEF HISTORY: Kevin Gardner is a 39 year old gentleman seen in the Emergency Room with increasing abdominal and lower extremity edema. He was recently hospitalized from late May to early June and was felt to be acute liver decompensation for alcoholic hepatitis. He was admitted with abdominal pain, nausea and vomiting felt to be related to acute alcohol intake. He was admitted for detox but then because his abdominal distention, nausea, vomiting and jaundice, worked up by the medical service. He was found to be severely jaundiced at the time with a bilirubin of 19.3 and albumin of 1.8. Liver function studies were otherwise largely unremarkable. His SGOT was 198. Platelet count was 70,000 on admission. His pro time was 23.1 with an INR of 2. He was seen by Dr. Niel Hummer from the GI service and felt to have acute alcoholic decompensation. He was detoxed and treated conservatively and his symptoms improved. At the time of discharge his bilirubin was 19.1 with an albumin of 1.5. INR 2.2. He has done well for the last several days, presented back to Emergency Room today complaining of increasing abdominal distention and lower extremity edema. He had marked lower extremity edema. White blood cell count had risen to 20,000 and he had a low-grade temperature. He underwent abdominal CT scan which demonstrated thickened gallbladder wall, multiple stones raises question of possible acute cholecystitis. The surgical service was consulted. Upon reviewing the records I felt that the patient most likely had an exacerbation of his liver disease rather than biliary tract disease and pointed out that he did not have  reasonable expectation of surgical survival with intervention so that I recommended follow up with the medical service and readmission. We are now consulted to follow him with regard to his gallbladder disease.   PAST MEDICAL HISTORY: His past medical history is largely unremarkable other than his alcohol abuse. He has had previous abdominal surgery as a child but he does not recall the surgical procedure.   SOCIAL HISTORY: He does drink 9 to 10 beers a day and drank as recently as yesterday. He does smoke cigarettes and uses marijuana occasionally.   CURRENT MEDICATIONS:  1. Lactulose 30 mg b.i.d. 2. Nadolol 20 mg once a day.  3. Oxycodone 10 mg q.6 hours p.r.n.  4. Prednisone 20 mg 2 tablets once a day. 5. Potassium supplements as needed.   ALLERGIES: He is not allergic to any medications.   FAMILY HISTORY: Family history is significant for family history of alcohol abuse.   REVIEW OF SYSTEMS: 10 point review of systems is reviewed with the primary care admission note and I am in agreement with that evaluation. He denies any history of previous gallbladder problems. Denies any abdominal pain. Denies any nausea or vomiting since his discharge from the hospital. Does not have any significant history of hepatitis in the past. Has not had any previous diagnosis of jaundice prior to this most recent hospital admission. Denies any previous diagnosis of gallbladder disease.   PHYSICAL EXAMINATION: VITAL SIGNS: On admission from the Emergency Room his blood pressure 126/84, heart rate 88 and regular. He is afebrile. Oxygen saturation is 98% and he rates his pain as a 10 primarily in his lower abdomen  and feet. Marland Kitchen.   HEENT: Reveals marked jaundice, mild facial edema. No facial deformities. Some minor skin abrasions and sores.   NECK: Supple without adenopathy. Trachea is midline. I cannot palpate his thyroid gland.   CHEST: His chest has some marked inspiratory wheezes but no other adventitious  sounds and no congestion. He has reduced pulmonary excursion.   CARDIAC: Exam reveals 2/6 systolic murmur with no gallop rhythms or cardiac dysrhythmia to my ear.   ABDOMEN: Mildly distended with some venous distention. He has an umbilical hernia with ascites in the hernia. He has a mass left upper quadrant which appears to be an abdominal wall hematoma or liver mass. He has no right upper quadrant tenderness. No midepigastric tenderness. No rebound. No guarding. No other hernias noted.   EXTREMITIES: Lower extremity exam reveals 4+ pitting edema with marked ulceration and significant erythematous change.   PSYCHIATRIC: Normal orientation, normal affect. In fact he is in denial about majority of his current symptoms.    LABORATORY, DIAGNOSTIC AND RADIOLOGICAL DATA: I have independently reviewed his CT scan comparing to the previous CT scan. His gallbladder does not appear to be significantly changed. There is more ascites this time and does appear to be a large abdominal wall hematoma or fluid collection. I don't see any indication for acute cholecystitis. I suspect this gallbladder wall thickening is related to his liver insufficiency.   ASSESSMENT AND RECOMMENDATIONS: Using the liver classification score his CHILD class is at least a C. His survival with any intra-abdominal procedure is severely limited and he would not be a candidate for surgical intervention. I do not think he has acute cholecystitis so I would not recommend percutaneous drainage of his gallbladder. Will continue to follow him while he is hospitalized but I do not see any situation in which we would consider surgical intervention.   ____________________________ Quentin Orealph L. Ely III, MD rle:cms D: 12/16/2011 17:39:14 ET T: 12/17/2011 09:20:13 ET JOB#: [id number]  cc: Quentin Orealph L. Ely III, MD, <Dictator> Lurline DelShaukat Iftikhar, MD Quentin OreALPH L ELY MD ELECTRONICALLY SIGNED 12/18/2011 18:00

## 2014-10-30 NOTE — Consult Note (Signed)
Pt legs feel better today, abd still painful, palpable enlarged spleen and liver, hematoma same.  PT  INR 1.8, K of 3, alb 1.5, WBC 23.7, TB 10.4, on prednisone and nadolol.  Likely needs something for chronic pain or will be more likely to resume drinking.  Would be concerned about strong narcotics however.  No further recommendations.  If goes home get him a follow up appointment with DR. Marva PandaSKULSKIE.  Electronic Signatures: Scot JunElliott, Robert T (MD)  (Signed on 16-Jun-13 08:18)  Authored  Last Updated: 16-Jun-13 08:18 by Scot JunElliott, Robert T (MD)

## 2014-10-30 NOTE — Consult Note (Signed)
Brief Consult Note: Diagnosis: Alcoholic hepatitis.   Patient was seen by consultant.   Discussed with Attending MD.   Comments: Patietn seen and examined.  Full consult to follow.   I am familiar with patient having seen him on weekend call coverage 2 weeks ago.  Admitted at that time with acute etoh realted hepatitis.  He was started at that time on steroid, which he has comntinued.  He denies ETOH use since going home.  He presents today with new luq pain, increased edema in the lower extremities.  Labs reviewed in comparison to prior hospitalization.  PT and total bili have improved slightly with albumin slightly decreased.  LFTs otherwise stable.  Increased wbc noted and he has been placed on iv meropenem.  CT reviewed.  There appears to be a small fluid collection in the left abdiminus rectus at the site of the new abdominal pain.  Possible rectus hematoma?  Awaiting radiology review.  Continue current meds.  May need to consider low dose albumen infusion as assist to diuresis.  Following.  Electronic Signatures: Barnetta ChapelSkulskie, Kerryn Tennant (MD)  (Signed 10-Jun-13 19:23)  Authored: Brief Consult Note   Last Updated: 10-Jun-13 19:23 by Barnetta ChapelSkulskie, Marshon Bangs (MD)
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
## 2014-10-30 NOTE — Consult Note (Signed)
Brief Consult Note: Diagnosis: ETOH hepatitis.   Patient was seen by consultant.   Discussed with Attending MD.   Comments: Patient with nausea, vomiting and diarrhea probaly ETOH related vs gastroentritis. Hyperbilirubinemia, coagulopathy and thrombocytopenia most likely ETOH hepatitis with hepatic dysfunction. Doubt acute cholecystitis. ? Colitis.  Recommendations: Daily LFT's and INR. NPO except ice chips. IV antibiotics. Stool sudies. Poor prognosis although patient is not a transplant candidate due to history of heavy ETOH abuse. Will follow.  Electronic Signatures: Lurline DelIftikhar, Gwynn Crossley (MD)  (Signed [phone]-May-13 21:05)  Authored: Brief Consult Note   Last Updated: 30-May-13 21:05 by Lurline DelIftikhar, Marcelis Wissner (MD)

## 2014-10-30 NOTE — Consult Note (Signed)
WBC 21K, VSS, elevated MCV of 120.  B-12 level to be done.  Severe alcoholism.  Hematoma on left lateral abd.severe inflam and weeping of legs.  He will need follow up with someone after discharge.  Exam of abd shows enlarged liver and spleen.  No new suggestions except to stop alcohol.    Electronic Signatures: Scot JunElliott, Haeleigh Streiff T (MD)  (Signed on 15-Jun-13 08:17)  Authored  Last Updated: 15-Jun-13 08:17 by Scot JunElliott, Thurmond Hildebran T (MD)

## 2014-10-30 NOTE — Consult Note (Signed)
Chief Complaint:   Subjective/Chief Complaint stable, several watery stools but denies bleeding in the stool.  continues with some generalized abdominal pain, minimal nausea, no v.   VITAL SIGNS/ANCILLARY NOTES: **Vital Signs.:   02-Jun-13 09:57   Vital Signs Type Q 4hr   Temperature Temperature (F) 98.4   Celsius 36.8   Temperature Source oral   Pulse Pulse 126   Pulse source per Dinamap   Respirations Respirations 20   Systolic BP Systolic BP 93   Diastolic BP (mmHg) Diastolic BP (mmHg) 57   Mean BP 69   BP Source vital sign device   Pulse Ox Activity Level  At rest   Oxygen Delivery Room Air/ 21 %   Brief Assessment:   Cardiac Regular    Respiratory clear BS    Gastrointestinal details normal near tense ascites, tender hepatomegally, bs positive, no rebound.    Additional Physical Exam mild asterixis   Assessment/Plan:  Assessment/Plan:   Assessment 1) etoh hepatitis- labs stable, mild inprovemnt of bile, PT today.  Of note, renal fnx is stable.    Plan 1) continue current.  extensive discusseion with patient and family today re etoh use and abstinence. will recheck ammonia in am.   Electronic Signatures: Barnetta ChapelSkulskie, Gaius Ishaq (MD)  (Signed 02-Jun-13 13:09)  Authored: Chief Complaint, VITAL SIGNS/ANCILLARY NOTES, Brief Assessment, Assessment/Plan   Last Updated: 02-Jun-13 13:09 by Barnetta ChapelSkulskie, Sherry Rogus (MD)
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
## 2015-03-25 ENCOUNTER — Emergency Department
Admission: EM | Admit: 2015-03-25 | Discharge: 2015-03-25 | Disposition: A | Discharge status: Home / Self Care | Payer: Medicaid Other | Attending: Emergency Medicine | Admitting: Emergency Medicine

## 2015-03-25 ENCOUNTER — Encounter: Payer: Self-pay | Admitting: Emergency Medicine

## 2015-03-25 DIAGNOSIS — Y9289 Other specified places as the place of occurrence of the external cause: Secondary | ICD-10-CM | POA: Diagnosis not present

## 2015-03-25 DIAGNOSIS — Y288XXA Contact with other sharp object, undetermined intent, initial encounter: Secondary | ICD-10-CM | POA: Insufficient documentation

## 2015-03-25 DIAGNOSIS — S61211A Laceration without foreign body of left index finger without damage to nail, initial encounter: Secondary | ICD-10-CM

## 2015-03-25 DIAGNOSIS — Z720 Tobacco use: Secondary | ICD-10-CM | POA: Diagnosis not present

## 2015-03-25 DIAGNOSIS — Y9389 Activity, other specified: Secondary | ICD-10-CM | POA: Diagnosis not present

## 2015-03-25 DIAGNOSIS — Y998 Other external cause status: Secondary | ICD-10-CM | POA: Insufficient documentation

## 2015-03-25 HISTORY — DX: Unspecified cirrhosis of liver: K74.60

## 2015-03-25 MED ORDER — LIDOCAINE HCL (PF) 1 % IJ SOLN
INTRAMUSCULAR | Status: AC
  Administered 2015-03-25: 13:00:00
  Filled 2015-03-25: qty 5

## 2015-03-25 MED ORDER — TRIPLE ANTIBIOTIC 3.5-400-5000 EX OINT
TOPICAL_OINTMENT | Freq: Once | CUTANEOUS | Status: AC
  Administered 2015-03-25: 1 via TOPICAL

## 2015-03-25 MED ORDER — BACITRACIN-NEOMYCIN-POLYMYXIN 400-5-5000 EX OINT
TOPICAL_OINTMENT | CUTANEOUS | Status: AC
  Administered 2015-03-25: 1 via TOPICAL
  Filled 2015-03-25: qty 1

## 2015-03-25 MED ORDER — TRAMADOL HCL 50 MG PO TABS: 50.0000 mg | ORAL_TABLET | Freq: Four times a day (QID) | ORAL | Status: AC | PRN

## 2015-03-25 NOTE — ED Notes (Signed)
States he was cutting a tree/bush.. Knife slipped   Laceration  Noted to left index finger   Bleeding controlled

## 2015-03-25 NOTE — ED Provider Notes (Signed)
[facility] Emergency Department Provider Note  ____________________________________________  Time seen: Approximately 12:40 PM  I have reviewed the triage vital signs and the nursing notes.   HISTORY  Chief Complaint Laceration    HPI Kevin Gardner is a 39 y.o. male laceration to the left index finger prior to arrival. Patient state he was cutting a tree and blade slipped and cut his left index finger. He which is controlled with direct pressure. Patient denies any loss sensation or loss of function. Patient stated last tetanus shot is up-to-date. Patient is rating his pain as 8/10. Patient is right-hand dominant.  Past Medical History  Diagnosis Date  . Cirrhosis     There are no active problems to display for this patient.   History reviewed. No pertinent past surgical history.  No current outpatient prescriptions on file.  Allergies Review of patient's allergies indicates no known allergies.  No family history on file.  Social History Social History  Substance Use Topics  . Smoking status: Current Every Day Smoker -- 0.50 packs/day    Types: Cigarettes  . Smokeless tobacco: None  . Alcohol Use: No    Review of Systems Constitutional: No fever/chills Eyes: No visual changes. ENT: No sore throat. Cardiovascular: Denies chest pain. Respiratory: Denies shortness of breath. Gastrointestinal: No abdominal pain.  No nausea, no vomiting.  No diarrhea.  No constipation. Genitourinary: Negative for dysuria. Musculoskeletal: Negative for back pain. Skin: Negative for rash. Laceration left index finger Neurological: Negative for headaches, focal weakness or numbness. 10-point ROS otherwise negative.  ____________________________________________   PHYSICAL EXAM:  VITAL SIGNS: ED Triage Vitals  Enc Vitals Group     BP 03/25/15 1158 143/105 mmHg     Pulse Rate 03/25/15 1158 72     Resp 03/25/15 1158 18     Temp 03/25/15 1158 98 F  (36.7 C)     Temp Source 03/25/15 1158 Oral     SpO2 03/25/15 1158 100 %     Weight 03/25/15 1158 178 lb (80.74 kg)     Height 03/25/15 1158  (1.854 m)     Head Cir --      Peak Flow --      Pain Score 03/25/15 1159 8     Pain Loc --      Pain Edu? --      Excl. in GC? --     Constitutional: Alert and oriented. Well appearing and in no acute distress. Eyes: Conjunctivae are normal. PERRL. EOMI. Head: Atraumatic. Nose: No congestion/rhinnorhea. Mouth/Throat: Mucous membranes are moist.  Oropharynx non-erythematous. Neck: No stridor.   Hematological/Lymphatic/Immunilogical: No cervical lymphadenopathy. Cardiovascular: Normal rate, regular rhythm. Grossly normal heart sounds.  Good peripheral circulation. Respiratory: Normal respiratory effort.  No retractions. Lungs CTAB. Gastrointestinal: Soft and nontender. No distention. No abdominal bruits. No CVA tenderness. Musculoskeletal: No lower extremity tenderness nor edema.  No joint effusions. Neurologic:  Normal speech and language. No gross focal neurologic deficits are appreciated. No gait instability. Skin:  Skin is warm, dry and intact. No rash noted. Psychiatric: Mood and affect are normal. Speech and behavior are normal.  ____________________________________________   LABS (all labs ordered are listed, but only abnormal results are displayed)  Labs Reviewed - No data to display ____________________________________________  EKG  ____________________________________________  RADIOLOGY   ____________________________________________   PROCEDURES  Procedure(s) performed:LACERATION REPAIR Performed by: Joni Reining Authorized by: Joni Reining Consent: Verbal consent obtained. Risks and benefits: risks, benefits and alternatives were discussed  Consent given by: patient Patient identity confirmed: provided demographic data Prepped and Draped in normal sterile fashion Wound explored  Laceration Location:  Left index finger  Laceration Length: 0.5 cm  No Foreign Bodies seen or palpated  Anesthesia: Digital block   Local anesthetic: lidocaine 1% without epi   Anesthetic total: 3 ML's   Irrigation method: syringe Amount of cleaning: standard  Skin closure: 5-0 nylon   Number of sutures: 3   Technique: Interrupted   Patient tolerance: Patient tolerated the procedure well with no immediate complications.  Critical Care performed: No  ____________________________________________   INITIAL IMPRESSION / ASSESSMENT AND PLAN / ED COURSE  Pertinent labs & imaging results that were available during my care of the patient were reviewed by me and considered in my medical decision making (see chart for details).  Laceration to the left index finger. Area was sutured. Patient again advised on home care.advised to follow-up 10 days for suture removal. Patient given a three-day prescription for tramadol. ____________________________________________   FINAL CLINICAL IMPRESSION(S) / ED DIAGNOSES  Final diagnoses:  Laceration of left index finger w/o foreign body w/o damage to nail, initial encounter      Joni Reining, PA-C 03/25/15 1301  Darien Ramus, MD 03/25/15 [id number]

## 2015-03-25 NOTE — ED Notes (Signed)
Pt was cutting a tree down today, blade went thru tree then thru second digit on left hand. Lac noted, bleeding controlled.

## 2015-04-03 ENCOUNTER — Emergency Department
Admission: EM | Admit: 2015-04-03 | Discharge: 2015-04-03 | Disposition: A | Discharge status: Home / Self Care | Payer: Medicaid Other | Attending: Emergency Medicine | Admitting: Emergency Medicine

## 2015-04-03 ENCOUNTER — Encounter: Payer: Self-pay | Admitting: Emergency Medicine

## 2015-04-03 DIAGNOSIS — Z4801 Encounter for change or removal of surgical wound dressing: Secondary | ICD-10-CM | POA: Diagnosis present

## 2015-04-03 DIAGNOSIS — Z720 Tobacco use: Secondary | ICD-10-CM | POA: Insufficient documentation

## 2015-04-03 DIAGNOSIS — Z4802 Encounter for removal of sutures: Secondary | ICD-10-CM

## 2015-04-03 NOTE — Discharge Instructions (Signed)

## 2015-04-03 NOTE — ED Notes (Signed)
Here for suture removal

## 2015-04-03 NOTE — ED Provider Notes (Signed)
[facility] Emergency Department Provider Note  ____________________________________________  Time seen: Approximately 12:44 PM  I have reviewed the triage vital signs and the nursing notes.   HISTORY  Chief Complaint No chief complaint on file.    HPI DAN SCEARCE is a 39 y.o. male patient here for suture removals we'll place this department on 03/25/2015. Patient left index finger suture secondary to laceration by a blade while cutting a tree branch. Patient was no complaints for the healing process. Denies any pain at this time. Patient has follow wound care instructions as directed.   Past Medical History  Diagnosis Date  . Cirrhosis   . Cirrhosis     There are no active problems to display for this patient.   No past surgical history on file.  Current Outpatient Rx  Name  Route  Sig  Dispense  Refill  . traMADol (ULTRAM) 50 MG tablet   Oral   Take 1 tablet (50 mg total) by mouth every 6 (six) hours as needed for moderate pain.   12 tablet   0     Allergies Ibuprofen; Tramadol; and Tylenol  No family history on file.  Social History Social History  Substance Use Topics  . Smoking status: Current Every Day Smoker -- 0.50 packs/day    Types: Cigarettes  . Smokeless tobacco: Not on file  . Alcohol Use: No    Review of Systems Constitutional: No fever/chills Eyes: No visual changes. ENT: No sore throat. Cardiovascular: Denies chest pain. Respiratory: Denies shortness of breath. Gastrointestinal: No abdominal pain.  No nausea, no vomiting.  No diarrhea.  No constipation. Genitourinary: Negative for dysuria. Musculoskeletal: Negative for back pain. Skin: Negative for rash. Laceration left index finger Neurological: Negative for headaches, focal weakness or numbness.  10-point ROS otherwise negative.  ____________________________________________   PHYSICAL EXAM:  VITAL SIGNS: ED Triage Vitals  Enc Vitals Group      BP --      Pulse --      Resp --      Temp --      Temp src --      SpO2 --      Weight --      Height --      Head Cir --      Peak Flow --      Pain Score --      Pain Loc --      Pain Edu? --      Excl. in GC? --     Constitutional: Alert and oriented. Well appearing and in no acute distress. Eyes: Conjunctivae are normal. PERRL. EOMI. Head: Atraumatic. Nose: No congestion/rhinnorhea. Mouth/Throat: Mucous membranes are moist.  Oropharynx non-erythematous. Neck: No stridor.   Hematological/Lymphatic/Immunilogical: No cervical lymphadenopathy. Cardiovascular: Normal rate, regular rhythm. Grossly normal heart sounds.  Good peripheral circulation. Respiratory: Normal respiratory effort.  No retractions. Lungs CTAB. Gastrointestinal: Soft and nontender. No distention. No abdominal bruits. No CVA tenderness. Musculoskeletal: No lower extremity tenderness nor edema.  No joint effusions. Neurologic:  Normal speech and language. No gross focal neurologic deficits are appreciated. No gait instability. Skin:  Skin is warm, dry and intact. No rash noted. Healed laceration left index finger with no signs and symptoms of secondary infection. Psychiatric: Mood and affect are normal. Speech and behavior are normal.  ____________________________________________   LABS (all labs ordered are listed, but only abnormal results are displayed)  Labs Reviewed - No data to display ____________________________________________  EKG  ____________________________________________  RADIOLOGY   ____________________________________________   PROCEDURES  Procedure(s) performed: None  Critical Care performed: No  ____________________________________________   INITIAL IMPRESSION / ASSESSMENT AND PLAN / ED COURSE  Pertinent labs & imaging results that were available during my care of the patient were reviewed by me and considered in my medical decision making (see chart for  details).  Healed laceration left index finger. 3 simple sutures removed. Patient advised on after suture removal care. Patient advised follow this up ANY complications. ____________________________________________   FINAL CLINICAL IMPRESSION(S) / ED DIAGNOSES  Final diagnoses:  Encounter for removal of sutures      Joni Reining, PA-C 04/03/15 1249  Emily Filbert, MD 04/03/15 [phone]

## 2015-04-25 ENCOUNTER — Emergency Department (HOSPITAL_COMMUNITY)
Admission: EM | Admit: 2015-04-25 | Discharge: 2015-04-25 | Disposition: A | Discharge status: Home / Self Care | Payer: Medicaid Other | Attending: Emergency Medicine | Admitting: Emergency Medicine

## 2015-04-25 ENCOUNTER — Encounter (HOSPITAL_COMMUNITY): Payer: Self-pay | Admitting: *Deleted

## 2015-04-25 DIAGNOSIS — Y998 Other external cause status: Secondary | ICD-10-CM | POA: Diagnosis not present

## 2015-04-25 DIAGNOSIS — Y92009 Unspecified place in unspecified non-institutional (private) residence as the place of occurrence of the external cause: Secondary | ICD-10-CM | POA: Diagnosis not present

## 2015-04-25 DIAGNOSIS — Z8719 Personal history of other diseases of the digestive system: Secondary | ICD-10-CM | POA: Insufficient documentation

## 2015-04-25 DIAGNOSIS — W25XXXA Contact with sharp glass, initial encounter: Secondary | ICD-10-CM | POA: Diagnosis not present

## 2015-04-25 DIAGNOSIS — Z720 Tobacco use: Secondary | ICD-10-CM | POA: Diagnosis not present

## 2015-04-25 DIAGNOSIS — S61411A Laceration without foreign body of right hand, initial encounter: Secondary | ICD-10-CM | POA: Diagnosis present

## 2015-04-25 DIAGNOSIS — Y9389 Activity, other specified: Secondary | ICD-10-CM | POA: Diagnosis not present

## 2015-04-25 MED ORDER — LIDOCAINE HCL (PF) 1 % IJ SOLN
5.0000 mL | Freq: Once | INTRAMUSCULAR | Status: AC
  Administered 2015-04-25: 5 mL
  Filled 2015-04-25: qty 5

## 2015-04-25 MED ORDER — OXYCODONE HCL 5 MG PO TABS
5.0000 mg | ORAL_TABLET | Freq: Once | ORAL | Status: AC
  Administered 2015-04-25: 5 mg via ORAL
  Filled 2015-04-25: qty 1

## 2015-04-25 MED ORDER — BACITRACIN ZINC 500 UNIT/GM EX OINT
TOPICAL_OINTMENT | CUTANEOUS | Status: AC
  Administered 2015-04-25: 1
  Filled 2015-04-25: qty 0.9

## 2015-04-25 MED ORDER — BACITRACIN-NEOMYCIN-POLYMYXIN 400-5-5000 EX OINT: TOPICAL_OINTMENT | Freq: Once | CUTANEOUS | Status: DC

## 2015-04-25 NOTE — ED Notes (Signed)
Lac to side of right hand, cut on glass while washing dishes at work, last tetanus few weeks ago

## 2015-04-25 NOTE — Discharge Instructions (Signed)
Follow up in 7 to 10 days for suture removal. Return sooner for any signs of infection.

## 2015-04-25 NOTE — ED Provider Notes (Signed)
CSN: [id number]     Arrival date & time 04/25/15  1954 History   First MD Initiated Contact with Patient 04/25/15 2004     Chief Complaint  Patient presents with  . Extremity Laceration     (Consider location/radiation/quality/duration/timing/severity/associated sxs/prior Treatment) Patient is a 39 y.o. male presenting with skin laceration.  Laceration Location:  Hand Hand laceration location:  R hand Depth:  Through dermis Laceration mechanism:  Broken glass Pain details:    Quality:  Aching   Severity:  Mild Relieved by:  Pressure Tetanus status:  Up to date  Kevin Gardner is a 39 y.o. male with hx of cirrhosis who presents to the ED with a laceration to the right hand. He reports cutting his hand while washing dishes at home tonight, not at work. He is up to date on tetanus.  Past Medical History  Diagnosis Date  . Cirrhosis (HCC)   . Cirrhosis (HCC)    History reviewed. No pertinent past surgical history. History reviewed. No pertinent family history. Social History  Substance Use Topics  . Smoking status: Current Every Day Smoker -- 0.50 packs/day    Types: Cigarettes  . Smokeless tobacco: None  . Alcohol Use: No    Review of Systems Negative except as stated in HPI   Allergies  Ibuprofen; Tramadol; and Tylenol  Home Medications   Prior to Admission medications   Medication Sig Start Date End Date Taking? Authorizing Provider  traMADol (ULTRAM) 50 MG tablet Take 1 tablet (50 mg total) by mouth every 6 (six) hours as needed for moderate pain. 03/25/15   Joni Reining, PA-C   BP 121/78 mmHg  Pulse 88  Temp(Src) 98.6 F (37 C) (Oral)  Resp 18  Ht  (1.854 m)  Wt 167 lb (75.751 kg)  BMI 22.04 kg/m2  SpO2 98% Physical Exam  Constitutional: He is oriented to person, place, and time. He appears well-developed and well-nourished.  HENT:  Head: Normocephalic.  Eyes: EOM are normal.  Neck: Neck supple.  Cardiovascular: Normal rate.     Pulmonary/Chest: Effort normal.  Musculoskeletal: Normal range of motion.       Right hand: He exhibits tenderness and laceration. He exhibits normal range of motion, no bony tenderness and normal capillary refill. Normal sensation noted. Normal strength noted.       Hands: Neurological: He is alert and oriented to person, place, and time. No cranial nerve deficit.  Skin: Skin is warm and dry.  Psychiatric: He has a normal mood and affect. His behavior is normal.  Nursing note and vitals reviewed.   ED Course  .Marland KitchenLaceration Repair Date/Time: 04/25/2015 8:45 PM Performed by: Janne Napoleon Authorized by: Janne Napoleon Consent: Verbal consent obtained. Risks and benefits: risks, benefits and alternatives were discussed Consent given by: patient Patient understanding: patient states understanding of the procedure being performed Required items: required blood products, implants, devices, and special equipment available Patient identity confirmed: verbally with patient Body area: upper extremity Location details: right hand Laceration length: 3 cm Tendon involvement: none Nerve involvement: none Vascular damage: no Anesthesia: local infiltration Local anesthetic: lidocaine 1% without epinephrine Anesthetic total: 4 ml Patient sedated: no Preparation: Patient was prepped and draped in the usual sterile fashion. Irrigation solution: saline Irrigation method: syringe Amount of cleaning: standard Debridement: none Degree of undermining: none Skin closure: 4-0 Prolene Number of sutures: 4 Technique: simple Approximation: close Approximation difficulty: simple Dressing: pressure dressing    MDM  39 y.o. male with  laceration to the right hand stable for d/c without focal neuro deficits. He will follow up for suture removal in 7 to 10 days or sooner for any signs of infection. Discussed with the patient and all questioned fully answered.   Final diagnoses:  Laceration of right  hand, initial encounter       [facility] Campus[facility] M Neese, NP 04/25/15 2112  Bethann BerkshireJoseph Zammit, MD 04/25/15 [phone]

## 2016-02-03 ENCOUNTER — Other Ambulatory Visit
Admit: 2016-02-03 | Discharge: 2016-02-03 | Disposition: A | Discharge status: Home / Self Care | Attending: Family Medicine | Admitting: Family Medicine

## 2016-02-03 NOTE — ED Notes (Signed)
Pt brought in for police blood draw only, blood drawn per hospital policy and given to officer Mechele Collin with [employer] [employer]. Pt tolerated well and was calm and cooperative. Pt released to police custody.

## 2016-02-19 ENCOUNTER — Emergency Department
Admission: EM | Admit: 2016-02-19 | Discharge: 2016-02-20 | Disposition: A | Discharge status: Home / Self Care | Payer: Medicaid Other | Attending: Emergency Medicine | Admitting: Emergency Medicine

## 2016-02-19 ENCOUNTER — Emergency Department: Payer: Medicaid Other

## 2016-02-19 ENCOUNTER — Encounter: Payer: Self-pay | Admitting: Emergency Medicine

## 2016-02-19 DIAGNOSIS — H1132 Conjunctival hemorrhage, left eye: Secondary | ICD-10-CM | POA: Insufficient documentation

## 2016-02-19 DIAGNOSIS — Y9389 Activity, other specified: Secondary | ICD-10-CM | POA: Diagnosis not present

## 2016-02-19 DIAGNOSIS — Y929 Unspecified place or not applicable: Secondary | ICD-10-CM | POA: Insufficient documentation

## 2016-02-19 DIAGNOSIS — W228XXA Striking against or struck by other objects, initial encounter: Secondary | ICD-10-CM | POA: Insufficient documentation

## 2016-02-19 DIAGNOSIS — Y999 Unspecified external cause status: Secondary | ICD-10-CM | POA: Insufficient documentation

## 2016-02-19 DIAGNOSIS — F17210 Nicotine dependence, cigarettes, uncomplicated: Secondary | ICD-10-CM | POA: Insufficient documentation

## 2016-02-19 DIAGNOSIS — H209 Unspecified iridocyclitis: Secondary | ICD-10-CM | POA: Diagnosis not present

## 2016-02-19 DIAGNOSIS — S0592XA Unspecified injury of left eye and orbit, initial encounter: Secondary | ICD-10-CM | POA: Diagnosis present

## 2016-02-19 LAB — CBC
HEMATOCRIT: 46.6 % (ref 40.0–52.0)
HEMOGLOBIN: 16 g/dL (ref 13.0–18.0)
MCH: 34.4 pg — AB (ref 26.0–34.0)
MCHC: 34.2 g/dL (ref 32.0–36.0)
MCV: 100.6 fL — AB (ref 80.0–100.0)
Platelets: 110 10*3/uL — ABNORMAL LOW (ref 150–440)
RBC: 4.63 MIL/uL (ref 4.40–5.90)
RDW: 14.3 % (ref 11.5–14.5)
WBC: 2.6 10*3/uL — ABNORMAL LOW (ref 3.8–10.6)

## 2016-02-19 MED ORDER — TETRACAINE HCL 0.5 % OP SOLN
2.0000 [drp] | Freq: Once | OPHTHALMIC | Status: AC
  Administered 2016-02-19: 2 [drp] via OPHTHALMIC
  Filled 2016-02-19: qty 2

## 2016-02-19 MED ORDER — IOPAMIDOL (ISOVUE-300) INJECTION 61%
75.0000 mL | Freq: Once | INTRAVENOUS | Status: AC | PRN
  Administered 2016-02-19: 75 mL via INTRAVENOUS
  Filled 2016-02-19: qty 75

## 2016-02-19 MED ORDER — FLUORESCEIN SODIUM 1 MG OP STRP
1.0000 | ORAL_STRIP | Freq: Once | OPHTHALMIC | Status: AC
  Administered 2016-02-19: 1 via OPHTHALMIC
  Filled 2016-02-19: qty 1

## 2016-02-19 MED ORDER — ONDANSETRON 8 MG PO TBDP
8.0000 mg | ORAL_TABLET | Freq: Once | ORAL | Status: AC
  Administered 2016-02-19: 8 mg via ORAL
  Filled 2016-02-19: qty 1

## 2016-02-19 MED ORDER — OXYCODONE HCL 5 MG PO TABS
5.0000 mg | ORAL_TABLET | Freq: Once | ORAL | Status: AC
  Administered 2016-02-19: 5 mg via ORAL
  Filled 2016-02-19: qty 1

## 2016-02-19 NOTE — ED Notes (Addendum)
Pt reports that last night he was helping his father "pop tin off the trailer" and pieces of it popped off - this am his left eye was matted and swollen shut - left eye is painful and decreased vision - lower eyelid is red and swollen - pt reports that his eye is "leaking blood"

## 2016-02-19 NOTE — ED Provider Notes (Signed)
[facility] Emergency Department Provider Note  ____________________________________________  Time seen: Approximately 10:58 PM  I have reviewed the triage vital signs and the nursing notes.   HISTORY  Chief Complaint Foreign Body in Eye    HPI Kevin Gardner is a 40 y.o. male who presents emergency department complaining of left eye pain. Patient states that he was helping a family member tear only minimal off of a trailer when a piece came back and hit him in the eye. Patient states that initially he didn't have any symptoms or pain. He went to bed as usual. This morning he woke up and states that his eye was "matted shut." He reports that he has had bloody drainage from his left eye throughout the day. He reports that his vision has worsened over the day. Patient reports that he now has bruising around his eye. Patient denies visual loss but states that everything is "blurry" and that he cannot make out any objects with his left eye. Patient reports he has 20/20 vision in both eyes and has never worn glasses or contacts.   Past Medical History:  Diagnosis Date  . Cirrhosis (HCC)   . Cirrhosis (HCC)     There are no active problems to display for this patient.   Past Surgical History:  Procedure Laterality Date  . HERNIA REPAIR      Prior to Admission medications   Medication Sig Start Date End Date Taking? Authorizing Provider  prednisoLONE acetate (PRED FORTE) 1 % ophthalmic suspension Place 1 drop into the left eye every 2 (two) hours while awake. 02/20/16   Delorise Royals Cuthriell, PA-C  traMADol (ULTRAM) 50 MG tablet Take 1 tablet (50 mg total) by mouth every 6 (six) hours as needed for moderate pain. 03/25/15   Joni Reining, PA-C    Allergies Ibuprofen; Tramadol; and Tylenol [acetaminophen]  No family history on file.  Social History Social History  Substance Use Topics  . Smoking status: Current Every Day Smoker    Packs/day: 0.50     Types: Cigarettes  . Smokeless tobacco: Never Used  . Alcohol use No     Review of Systems  Constitutional: No fever/chills Eyes: The patient reports pain to the left eye, "bleeding" from the left eye, ecchymosis surrounding I, and severe vision loss to left eye. ENT: No upper respiratory complaints. Cardiovascular: no chest pain. Respiratory: no cough. No SOB. Musculoskeletal: Negative for musculoskeletal pain. Skin: Negative for rash, abrasions, lacerations, ecchymosis. Neurological: Negative for headaches, focal weakness or numbness. 10-point ROS otherwise negative.  ____________________________________________   PHYSICAL EXAM:  VITAL SIGNS: ED Triage Vitals  Enc Vitals Group     BP 02/19/16 2155 124/88     Pulse Rate 02/19/16 2155 100     Resp --      Temp 02/19/16 2155 97.7 F (36.5 C)     Temp Source 02/19/16 2155 Oral     SpO2 02/19/16 2155 100 %     Weight 02/19/16 2155 176 lb (79.8 kg)     Height 02/19/16 2155 6\' 1"  (1.854 m)     Head Circumference --      Peak Flow --      Pain Score 02/19/16 2206 10     Pain Loc --      Pain Edu? --      Excl. in GC? --      Constitutional: Alert and oriented. Well appearing and in no acute distress. Eyes: Subconjunctival hemorrhaging is appreciated  to the left eye. The patient is extremely sensitive to the light. With assessment for the scope, patient is unable to tolerate the light enough to have any kind of visualization. Tono-Pen is attempted, right eye readings are as follows: 18, 20, 21, 26. Unable to obtain readings on left eye. Fluorescein staining reveals no areas of uptake to the left eye. Extraocular motions are intact to the left eye. PERRL.  Head: Atraumatic. Cardiovascular: Normal rate, regular rhythm. Normal S1 and S2.  Good peripheral circulation. Respiratory: Normal respiratory effort without tachypnea or retractions. Lungs CTAB. Good air entry to the bases with no decreased or absent breath  sounds. Musculoskeletal: Full range of motion to all extremities. No gross deformities appreciated. Neurologic:  Normal speech and language. No gross focal neurologic deficits are appreciated.  Skin:  Skin is warm, dry and intact. No rash noted. Psychiatric: Mood and affect are normal. Speech and behavior are normal. Patient exhibits appropriate insight and judgement.   ____________________________________________   LABS (all labs ordered are listed, but only abnormal results are displayed)  Labs Reviewed  CBC - Abnormal; Notable for the following:       Result Value   WBC 2.6 (*)    MCV 100.6 (*)    MCH 34.4 (*)    Platelets 110 (*)    All other components within normal limits  BASIC METABOLIC PANEL - Abnormal; Notable for the following:    Potassium 3.3 (*)    BUN <5 (*)    Calcium 8.1 (*)    All other components within normal limits   ____________________________________________  EKG   ____________________________________________  RADIOLOGY Festus BarrenI, Jonathan D Cuthriell, personally viewed and evaluated these images (plain radiographs) as part of my medical decision making, as well as reviewing the written report by the radiologist.  Ct Orbits W Contrast  Result Date: 02/19/2016 CLINICAL DATA:  Left eye injury last night. Was working with metal. Subconjunctival hemorrhage and ecchymosis. Awoke today with significant periorbital swelling. EXAM: CT ORBITS WITH CONTRAST TECHNIQUE: Multidetector CT imaging of the orbits was performed following the bolus administration of intravenous contrast. CONTRAST:  75mL ISOVUE-300 IOPAMIDOL (ISOVUE-300) INJECTION 61% COMPARISON:  None. FINDINGS: Left preseptal soft tissue swelling is present. This is most prominent within the lower eyelid. There is no underlying globe injury. No radiopaque foreign body is present. The lens is located. No significant postseptal inflammatory changes are present. The rectus musculature is within normal limits. The  optic nerve is normal. Lacrimal glands are normal bilaterally. Slight leftward nasal deformity suggests remote fracture. Leftward nasal spurring is present with bowing of the nasal septum to the right anteriorly. The nasal cavity is clear. Minimal mucosal thickening is present along the inferior lateral maxillary sinus. The ostiomeatal complex is patent. A prominent Haller cell is present on the right. A prominent ethmoid bulla is noted on the left. Limited imaging the brain is unremarkable. IMPRESSION: 1. Preseptal infraorbital soft tissue swelling on the left mostly involving the lower eye lid. 2. No postseptal invasion. 3. No globe injury. 4. No significant radiopaque foreign body. 5. Remote nasal bone fractures. Electronically Signed   By: Marin Robertshristopher  Mattern M.D.   On: 02/19/2016 23:49    ____________________________________________    PROCEDURES  Procedure(s) performed:    Procedures    Medications  prednisoLONE acetate (PRED FORTE) 1 % ophthalmic suspension 1 drop (not administered)  oxyCODONE (Oxy IR/ROXICODONE) immediate release tablet 10 mg (not administered)  fluorescein ophthalmic strip 1 strip (1 strip Left Eye Given 02/19/16  2302)  tetracaine (PONTOCAINE) 0.5 % ophthalmic solution 2 drop (2 drops Left Eye Given 02/19/16 2302)  oxyCODONE (Oxy IR/ROXICODONE) immediate release tablet 5 mg (5 mg Oral Given 02/19/16 2305)  ondansetron (ZOFRAN-ODT) disintegrating tablet 8 mg (8 mg Oral Given 02/19/16 2305)  iopamidol (ISOVUE-300) 61 % injection 75 mL (75 mLs Intravenous Contrast Given 02/19/16 2332)     ____________________________________________   INITIAL IMPRESSION / ASSESSMENT AND PLAN / ED COURSE  Pertinent labs & imaging results that were available during my care of the patient were reviewed by me and considered in my medical decision making (see chart for details).  Clinical Course    Patient's diagnosis is consistent with Eye injury. Patient's eye exam was limited in  nature due to symptoms. CT reveals no acute abnormality. Over the lateral region of the left eye. I discussed the case with the on call ophthalmologist, Dr Inez PilgrimBrasington. I discussed the limited nature of my fundoscopic exam. Based off of symptoms, results at this time, ophthalmologist states this is most consistent with iritis. Ophthalmologist recommended using prednisolone eyedrops every 2 hours and will follow-up with him in the morning.. Patient understands probable diagnosis and treatment plan. Patient is given ED precautions to return to the ED for any worsening or new symptoms.     ____________________________________________  FINAL CLINICAL IMPRESSION(S) / ED DIAGNOSES  Final diagnoses:  Eye injury, left, initial encounter  Iritis      NEW MEDICATIONS STARTED DURING THIS VISIT:  New Prescriptions   PREDNISOLONE ACETATE (PRED FORTE) 1 % OPHTHALMIC SUSPENSION    Place 1 drop into the left eye every 2 (two) hours while awake.        This chart was dictated using voice recognition software/Dragon. Despite best efforts to proofread, errors can occur which can change the meaning. Any change was purely unintentional.    Racheal PatchesJonathan D Cuthriell, PA-C 02/20/16 0023    Minna AntisKevin Paduchowski, MD 02/23/16 [phone]

## 2016-02-19 NOTE — ED Triage Notes (Signed)
Pt reports was working on trailer, states cut a piece off the trailer. States "it felt like something but not really so I just went to bed." Pt reports tonight LEFt eye has been bleeding and red. Bruising noted to LEFT eye.

## 2016-02-20 LAB — BASIC METABOLIC PANEL
Anion gap: 8 (ref 5–15)
CO2: 26 mmol/L (ref 22–32)
Calcium: 8.1 mg/dL — ABNORMAL LOW (ref 8.9–10.3)
Chloride: 104 mmol/L (ref 101–111)
Creatinine, Ser: 0.74 mg/dL (ref 0.61–1.24)
GFR calc Af Amer: >60 mL/min (ref >60)
GLUCOSE: 79 mg/dL (ref 65–99)
POTASSIUM: 3.3 mmol/L — AB (ref 3.5–5.1)
Sodium: 138 mmol/L (ref 135–145)

## 2016-02-20 MED ORDER — PREDNISOLONE ACETATE 1 % OP SUSP: 1.0000 [drp] | OPHTHALMIC | 0 refills | Status: AC

## 2016-02-20 MED ORDER — OXYCODONE HCL 5 MG PO TABS
10.0000 mg | ORAL_TABLET | Freq: Once | ORAL | Status: AC
  Administered 2016-02-20: 10 mg via ORAL
  Filled 2016-02-20: qty 2

## 2016-02-20 MED ORDER — PREDNISOLONE ACETATE 1 % OP SUSP
1.0000 [drp] | Freq: Once | OPHTHALMIC | Status: AC
  Administered 2016-02-20: 1 [drp] via OPHTHALMIC
  Filled 2016-02-20: qty 5

## 2016-02-20 NOTE — ED Notes (Signed)
Medications administered to patient by Gala RomneyJonathan Cuthriell, PA.  Medications wrappers thrown away before they could be scanned. PA to verify.
